# Patient Record
Sex: Male | Born: 1955 | Race: White | Hispanic: No | Smoking: Never smoker
Health system: Southern US, Community
[De-identification: ages and names within clinical notes are randomized; demographics above are authoritative.]

## PROBLEM LIST (undated history)

## (undated) DIAGNOSIS — E119 Type 2 diabetes mellitus without complications: Secondary | ICD-10-CM

## (undated) DIAGNOSIS — I10 Essential (primary) hypertension: Secondary | ICD-10-CM

## (undated) DIAGNOSIS — I2510 Atherosclerotic heart disease of native coronary artery without angina pectoris: Secondary | ICD-10-CM

## (undated) DIAGNOSIS — E785 Hyperlipidemia, unspecified: Secondary | ICD-10-CM

## (undated) DIAGNOSIS — F419 Anxiety disorder, unspecified: Secondary | ICD-10-CM

## (undated) HISTORY — DX: Essential (primary) hypertension: I10

## (undated) HISTORY — DX: Atherosclerotic heart disease of native coronary artery without angina pectoris: I25.10

## (undated) HISTORY — DX: Anxiety disorder, unspecified: F41.9

## (undated) HISTORY — DX: Hyperlipidemia, unspecified: E78.5

## (undated) HISTORY — DX: Type 2 diabetes mellitus without complications: E11.9

## (undated) HISTORY — PX: CORONARY ARTERY BYPASS GRAFT: SHX141

## (undated) HISTORY — PX: EXPLORATION POST OPERATIVE OPEN HEART: SHX5061

---

## 2003-07-04 ENCOUNTER — Emergency Department (HOSPITAL_COMMUNITY): Admission: EM | Admit: 2003-07-04 | Discharge: 2003-07-04 | Payer: Self-pay | Admitting: Emergency Medicine

## 2003-07-04 ENCOUNTER — Encounter: Payer: Self-pay | Admitting: Emergency Medicine

## 2003-07-25 ENCOUNTER — Ambulatory Visit: Admission: RE | Admit: 2003-07-25 | Discharge: 2003-07-25 | Payer: Self-pay | Admitting: Family Medicine

## 2003-07-25 ENCOUNTER — Encounter: Payer: Self-pay | Admitting: Family Medicine

## 2003-08-23 ENCOUNTER — Emergency Department (HOSPITAL_COMMUNITY): Admission: EM | Admit: 2003-08-23 | Discharge: 2003-08-23 | Payer: Self-pay

## 2003-08-24 ENCOUNTER — Encounter: Payer: Self-pay | Admitting: Emergency Medicine

## 2003-09-03 ENCOUNTER — Ambulatory Visit (HOSPITAL_COMMUNITY): Admission: RE | Admit: 2003-09-03 | Discharge: 2003-09-03 | Payer: Self-pay | Admitting: Gastroenterology

## 2018-12-01 DIAGNOSIS — E785 Hyperlipidemia, unspecified: Secondary | ICD-10-CM | POA: Insufficient documentation

## 2018-12-01 DIAGNOSIS — M109 Gout, unspecified: Secondary | ICD-10-CM | POA: Insufficient documentation

## 2018-12-01 DIAGNOSIS — I25118 Atherosclerotic heart disease of native coronary artery with other forms of angina pectoris: Secondary | ICD-10-CM | POA: Insufficient documentation

## 2018-12-01 DIAGNOSIS — F419 Anxiety disorder, unspecified: Secondary | ICD-10-CM | POA: Insufficient documentation

## 2018-12-01 DIAGNOSIS — E291 Testicular hypofunction: Secondary | ICD-10-CM | POA: Insufficient documentation

## 2018-12-01 DIAGNOSIS — E119 Type 2 diabetes mellitus without complications: Secondary | ICD-10-CM | POA: Insufficient documentation

## 2019-01-27 DIAGNOSIS — H1013 Acute atopic conjunctivitis, bilateral: Secondary | ICD-10-CM | POA: Diagnosis not present

## 2019-01-27 DIAGNOSIS — E119 Type 2 diabetes mellitus without complications: Secondary | ICD-10-CM | POA: Diagnosis not present

## 2019-02-18 DIAGNOSIS — R6889 Other general symptoms and signs: Secondary | ICD-10-CM | POA: Diagnosis not present

## 2019-02-18 DIAGNOSIS — J181 Lobar pneumonia, unspecified organism: Secondary | ICD-10-CM | POA: Diagnosis not present

## 2019-02-18 DIAGNOSIS — R05 Cough: Secondary | ICD-10-CM | POA: Diagnosis not present

## 2019-04-14 DIAGNOSIS — Z23 Encounter for immunization: Secondary | ICD-10-CM | POA: Diagnosis not present

## 2019-04-14 DIAGNOSIS — Z0000 Encounter for general adult medical examination without abnormal findings: Secondary | ICD-10-CM | POA: Diagnosis not present

## 2019-04-14 DIAGNOSIS — E119 Type 2 diabetes mellitus without complications: Secondary | ICD-10-CM | POA: Diagnosis not present

## 2019-04-14 DIAGNOSIS — Z1159 Encounter for screening for other viral diseases: Secondary | ICD-10-CM | POA: Diagnosis not present

## 2019-04-14 DIAGNOSIS — E785 Hyperlipidemia, unspecified: Secondary | ICD-10-CM | POA: Diagnosis not present

## 2019-04-14 DIAGNOSIS — L723 Sebaceous cyst: Secondary | ICD-10-CM | POA: Diagnosis not present

## 2019-04-14 DIAGNOSIS — I10 Essential (primary) hypertension: Secondary | ICD-10-CM | POA: Diagnosis not present

## 2019-04-14 DIAGNOSIS — F419 Anxiety disorder, unspecified: Secondary | ICD-10-CM | POA: Diagnosis not present

## 2019-04-14 DIAGNOSIS — I25119 Atherosclerotic heart disease of native coronary artery with unspecified angina pectoris: Secondary | ICD-10-CM | POA: Diagnosis not present

## 2019-04-14 DIAGNOSIS — M109 Gout, unspecified: Secondary | ICD-10-CM | POA: Diagnosis not present

## 2019-04-15 DIAGNOSIS — F419 Anxiety disorder, unspecified: Secondary | ICD-10-CM | POA: Diagnosis not present

## 2019-04-15 DIAGNOSIS — M109 Gout, unspecified: Secondary | ICD-10-CM | POA: Diagnosis not present

## 2019-04-15 DIAGNOSIS — Z951 Presence of aortocoronary bypass graft: Secondary | ICD-10-CM | POA: Insufficient documentation

## 2019-04-15 DIAGNOSIS — I2510 Atherosclerotic heart disease of native coronary artery without angina pectoris: Secondary | ICD-10-CM | POA: Diagnosis not present

## 2019-04-15 DIAGNOSIS — E782 Mixed hyperlipidemia: Secondary | ICD-10-CM | POA: Diagnosis not present

## 2019-04-15 DIAGNOSIS — I10 Essential (primary) hypertension: Secondary | ICD-10-CM | POA: Diagnosis not present

## 2019-04-15 DIAGNOSIS — R0789 Other chest pain: Secondary | ICD-10-CM | POA: Diagnosis not present

## 2019-04-15 DIAGNOSIS — Z79899 Other long term (current) drug therapy: Secondary | ICD-10-CM | POA: Diagnosis not present

## 2019-04-15 DIAGNOSIS — E785 Hyperlipidemia, unspecified: Secondary | ICD-10-CM | POA: Diagnosis not present

## 2019-04-15 DIAGNOSIS — R001 Bradycardia, unspecified: Secondary | ICD-10-CM | POA: Diagnosis not present

## 2019-04-15 DIAGNOSIS — I4519 Other right bundle-branch block: Secondary | ICD-10-CM | POA: Diagnosis not present

## 2019-04-15 DIAGNOSIS — R072 Precordial pain: Secondary | ICD-10-CM | POA: Diagnosis not present

## 2019-04-15 DIAGNOSIS — R079 Chest pain, unspecified: Secondary | ICD-10-CM | POA: Diagnosis not present

## 2019-04-15 DIAGNOSIS — Z7982 Long term (current) use of aspirin: Secondary | ICD-10-CM | POA: Diagnosis not present

## 2019-04-15 DIAGNOSIS — Z87891 Personal history of nicotine dependence: Secondary | ICD-10-CM | POA: Diagnosis not present

## 2019-06-08 DIAGNOSIS — Z951 Presence of aortocoronary bypass graft: Secondary | ICD-10-CM | POA: Diagnosis not present

## 2019-06-08 DIAGNOSIS — E782 Mixed hyperlipidemia: Secondary | ICD-10-CM | POA: Diagnosis not present

## 2019-06-08 DIAGNOSIS — I10 Essential (primary) hypertension: Secondary | ICD-10-CM | POA: Diagnosis not present

## 2019-06-08 DIAGNOSIS — I25119 Atherosclerotic heart disease of native coronary artery with unspecified angina pectoris: Secondary | ICD-10-CM | POA: Diagnosis not present

## 2019-06-08 DIAGNOSIS — I25118 Atherosclerotic heart disease of native coronary artery with other forms of angina pectoris: Secondary | ICD-10-CM | POA: Diagnosis not present

## 2019-07-08 ENCOUNTER — Ambulatory Visit (INDEPENDENT_AMBULATORY_CARE_PROVIDER_SITE_OTHER): Payer: PPO

## 2019-07-08 ENCOUNTER — Ambulatory Visit (INDEPENDENT_AMBULATORY_CARE_PROVIDER_SITE_OTHER): Payer: PPO | Admitting: Orthopaedic Surgery

## 2019-07-08 ENCOUNTER — Encounter: Payer: Self-pay | Admitting: Orthopaedic Surgery

## 2019-07-08 ENCOUNTER — Telehealth: Payer: Self-pay

## 2019-07-08 DIAGNOSIS — G8929 Other chronic pain: Secondary | ICD-10-CM

## 2019-07-08 DIAGNOSIS — M25562 Pain in left knee: Secondary | ICD-10-CM

## 2019-07-08 MED ORDER — LIDOCAINE HCL 1 % IJ SOLN
3.0000 mL | INTRAMUSCULAR | Status: AC | PRN
  Administered 2019-07-08: 3 mL

## 2019-07-08 MED ORDER — METHYLPREDNISOLONE ACETATE 40 MG/ML IJ SUSP
40.0000 mg | INTRAMUSCULAR | Status: AC | PRN
  Administered 2019-07-08: 40 mg via INTRA_ARTICULAR

## 2019-07-08 NOTE — Telephone Encounter (Signed)
Noted  

## 2019-07-08 NOTE — Progress Notes (Signed)
Office Visit Note   Patient: Shane Garrett           Date of Birth: [birth year]           MRN: [id number] Visit Date: 07/08/2019              Requested by: No referring provider defined for this encounter. PCP: Verdell Carmine., MD   Assessment & Plan: Visit Diagnoses:  1. Chronic pain of left knee     Plan: I do think he is the perfect candidate for steroid injection in his left knee today and a hyaluronic acid injection in about 4 weeks from now to treat the pain from osteoarthritis and likely chronic gout as well.  He cannot take anti-inflammatories due to being on blood thinners.  He is to work on quad strength exercises.  He can get back to walking but I want him to avoid hills.  All question concerns were answered addressed.  We will see him back in 4 weeks to hopefully place hyaluronic acid into his left knee.  If he continues to have persistent pain will likely order an MRI of that knee as well.  Follow-Up Instructions: Return in about 4 weeks (around 08/05/2019).   Orders:  Orders Placed This Encounter  Procedures  . Large Joint Inj  . XR Knee 1-2 Views Left   No orders of the defined types were placed in this encounter.     Procedures: Large Joint Inj: L knee on 07/08/2019 10:15 AM Indications: diagnostic evaluation and pain Details: 22 G 1.5 in needle, superolateral approach  Arthrogram: No  Medications: 3 mL lidocaine 1 %; 40 mg methylPREDNISolone acetate 40 MG/ML Outcome: tolerated well, no immediate complications Procedure, treatment alternatives, risks and benefits explained, specific risks discussed. Consent was given by the patient. Immediately prior to procedure a time out was called to verify the correct patient, procedure, equipment, support staff and site/side marked as required. Patient was prepped and draped in the usual sterile fashion.       Clinical Data: No additional findings.   Subjective: Chief Complaint  Patient presents with  . Left  Knee - Pain  The patient is a very pleasant 63 year old gentleman who comes in for evaluation treatment of chronic left knee pain.  He does have a history of heart bypass surgery and is on blood thinning medication.  He also has a long history of chronic gout.  He is never had knee issues as a relates to his gout he states.  He does point to the medial joint line of his left knee that is the source of his pain.  It hurts with activities daily living and has decreased his mobility and his quality of life.  He is someone who likes to walk 2 miles a day.  He also rides a motorcycle and plays golf.  He is retired.  He is never injured his left knee before.  HPI  Review of Systems He currently denies any headache, chest pain, shortness of breath, fever, chills, nausea, vomiting  Objective: Vital Signs: There were no vitals taken for this visit.  Physical Exam He is alert and orient x3 and in no acute distress Ortho Exam Examination of his left knee shows just slight varus malalignment.  There is only slight effusion comparing his left and right knees.  There is quite significant medial joint line tenderness of his left knee.  The knee feels ligamentously stable with good range of motion. Specialty Comments:  No specialty comments available.  Imaging: Xr Knee 1-2 Views Left  Result Date: 07/08/2019 2 views of the left knee show well-maintained joint space.  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    PMFS History: There are no active problems to display for this patient.  History reviewed. No pertinent past medical history.  History reviewed. No pertinent family history.  History reviewed. No pertinent surgical history. Social History   Occupational History  . Not on file  Tobacco Use  . Smoking status: Not on file  Substance and Sexual Activity  . Alcohol use: Not on file  . Drug use: Not on file  . Sexual activity: Not on  file

## 2019-07-08 NOTE — Telephone Encounter (Signed)
Left knee gel injection ?

## 2019-07-09 ENCOUNTER — Telehealth: Payer: Self-pay

## 2019-07-09 NOTE — Telephone Encounter (Signed)
Submitted VOB for Monovisc, left knee. 

## 2019-07-10 ENCOUNTER — Telehealth: Payer: Self-pay

## 2019-07-10 NOTE — Telephone Encounter (Signed)
Approved for Monovisc, left knee. [place] Patient will be responsible for 20% OOP. May have a Co-pay of $30.00 No PA required  Appt. 08/05/2019 with Dr. Ninfa Linden

## 2019-07-30 ENCOUNTER — Ambulatory Visit (INDEPENDENT_AMBULATORY_CARE_PROVIDER_SITE_OTHER): Payer: PPO | Admitting: Orthopaedic Surgery

## 2019-07-30 ENCOUNTER — Telehealth: Payer: Self-pay | Admitting: Orthopaedic Surgery

## 2019-07-30 DIAGNOSIS — M25561 Pain in right knee: Secondary | ICD-10-CM

## 2019-07-30 MED ORDER — LIDOCAINE HCL 1 % IJ SOLN
3.0000 mL | INTRAMUSCULAR | Status: AC | PRN
  Administered 2019-07-30: 3 mL

## 2019-07-30 MED ORDER — METHYLPREDNISOLONE ACETATE 40 MG/ML IJ SUSP
40.0000 mg | INTRAMUSCULAR | Status: AC | PRN
  Administered 2019-07-30: 40 mg via INTRA_ARTICULAR

## 2019-07-30 NOTE — Progress Notes (Signed)
Office Visit Note   Patient: Shane Garrett           Date of Birth: 1956/05/24           MRN: [id number] Visit Date: 07/30/2019              Requested by: Verdell Carmine., MD [address] [place],  [place] [address] PCP: Verdell Carmine., MD   Assessment & Plan: Visit Diagnoses:  1. Acute pain of right knee     Plan: Since a steroid injection helped recently on his left knee I felt it was reasonable to try this on his right knee today as that he.  He understands fully the risks and benefits of injections and tolerated it well.  All question concerns were answered addressed.  Follow-up can be as needed.  Follow-Up Instructions: Return if symptoms worsen or fail to improve.   Orders:  Orders Placed This Encounter  Procedures  . Large Joint Inj   No orders of the defined types were placed in this encounter.     Procedures: Large Joint Inj: R knee on 07/30/2019 3:27 PM Indications: diagnostic evaluation and pain Details: 22 G 1.5 in needle, superolateral approach  Arthrogram: No  Medications: 3 mL lidocaine 1 %; 40 mg methylPREDNISolone acetate 40 MG/ML Outcome: tolerated well, no immediate complications Procedure, treatment alternatives, risks and benefits explained, specific risks discussed. Consent was given by the patient. Immediately prior to procedure a time out was called to verify the correct patient, procedure, equipment, support staff and site/side marked as required. Patient was prepped and draped in the usual sterile fashion.       Clinical Data: No additional findings.   Subjective: Chief Complaint  Patient presents with  . Right Knee - Pain  The patient comes in today requesting steroid injection in his right knee.  He has acute on chronic right knee pain and is leaving for the beach tomorrow.  He has had a successful injection recently and his left knee and that is done well for him so he would like to have on his right knee today.  He has had  x-rays already of this left knee that did show the right knee on the AP view and there is still well-maintained joint space and not right knee and I did look at those films again today.  He denies any locking catching with the right knee but does report some knee swelling.  It hurts mainly with activities.  HPI  Review of Systems He currently denies any headache, chest pain, shortness of breath, fever, chills, nausea, vomiting  Objective: Vital Signs: There were no vitals taken for this visit.  Physical Exam He is alert and orient x3 and in no acute distress.  Examination of his right knee shows good range of motion and his ligaments are stable but is globally tender and there is a slight effusion. Ortho Exam  Specialty Comments:  No specialty comments available.  Imaging: No results found. 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  PMFS History: There are no active problems to display for this patient.  No past medical history on file.  No family history on file.  No past surgical history on file. Social History   Occupational History  . Not on file  Tobacco Use  . Smoking status: Not on file  Substance and Sexual Activity  . Alcohol use:  Not on file  . Drug use: Not on file  . Sexual activity: Not on file

## 2019-07-30 NOTE — Telephone Encounter (Signed)
Called patient and he declined appointment- needed earlier appointment before going out of town. Canceled appointment scheduled 08/04/2019 at 9:15am

## 2019-07-30 NOTE — Telephone Encounter (Signed)
We can work him in next Tuesday or Wednesday morning since it's just an injection only

## 2019-07-30 NOTE — Telephone Encounter (Signed)
Scheduled today

## 2019-07-30 NOTE — Telephone Encounter (Signed)
Patient called asked if he can be worked into Dr Renard Hamper or [place] schedule to get a steroid injection in his right knee. Patient would like to get in  Early next week. The number to contact patient is [phone]

## 2019-08-04 ENCOUNTER — Ambulatory Visit: Payer: PPO | Admitting: Orthopaedic Surgery

## 2019-08-05 ENCOUNTER — Other Ambulatory Visit: Payer: Self-pay

## 2019-08-05 ENCOUNTER — Encounter: Payer: Self-pay | Admitting: Orthopaedic Surgery

## 2019-08-05 ENCOUNTER — Ambulatory Visit (INDEPENDENT_AMBULATORY_CARE_PROVIDER_SITE_OTHER): Payer: PPO | Admitting: Orthopaedic Surgery

## 2019-08-05 DIAGNOSIS — M1712 Unilateral primary osteoarthritis, left knee: Secondary | ICD-10-CM

## 2019-08-05 DIAGNOSIS — M25562 Pain in left knee: Secondary | ICD-10-CM

## 2019-08-05 DIAGNOSIS — G8929 Other chronic pain: Secondary | ICD-10-CM

## 2019-08-05 MED ORDER — HYALURONAN 88 MG/4ML IX SOSY
88.0000 mg | PREFILLED_SYRINGE | INTRA_ARTICULAR | Status: AC | PRN
  Administered 2019-08-05: 88 mg via INTRA_ARTICULAR

## 2019-08-05 NOTE — Progress Notes (Signed)
   Procedure Note  Patient: Shane Garrett             Date of Birth: November 20, 1956           MRN: [id number]             Visit Date: 08/05/2019  Procedures: Visit Diagnoses:  1. Chronic pain of left knee   2. Unilateral primary osteoarthritis, left knee     Large Joint Inj on 08/05/2019 9:22 AM Indications: diagnostic evaluation and pain Details: 22 G 1.5 in needle, superolateral approach  Arthrogram: No  Medications: 88 mg Hyaluronan 88 MG/4ML Outcome: tolerated well, no immediate complications Procedure, treatment alternatives, risks and benefits explained, specific risks discussed. Consent was given by the patient. Immediately prior to procedure a time out was called to verify the correct patient, procedure, equipment, support staff and site/side marked as required. Patient was prepped and draped in the usual sterile fashion.    The patient comes in today for scheduled hyaluronic acid injection in his left knee to treat the pain from osteoarthritis.  He is tried failed other conservative treatment measures including steroid injection.  He has been working on activity modification and quad strengthening exercises.  He has well-documented osteoarthritis of his left knee.  Examination left knee today there is no significant effusion.  He has good range of motion of his knee but global tenderness.  He did tolerate the Monovisc injection well without any issues.  All questions concerns were answered and addressed.  Follow-up is as needed.

## 2019-08-14 DIAGNOSIS — H8111 Benign paroxysmal vertigo, right ear: Secondary | ICD-10-CM | POA: Diagnosis not present

## 2019-10-05 ENCOUNTER — Ambulatory Visit (INDEPENDENT_AMBULATORY_CARE_PROVIDER_SITE_OTHER): Payer: PPO | Admitting: Family Medicine

## 2019-10-05 ENCOUNTER — Other Ambulatory Visit: Payer: Self-pay

## 2019-10-05 ENCOUNTER — Telehealth: Payer: Self-pay

## 2019-10-05 ENCOUNTER — Encounter: Payer: Self-pay | Admitting: Family Medicine

## 2019-10-05 VITALS — BP 118/82 | HR 60 | Temp 98.0°F | Ht 73.0 in | Wt 259.4 lb

## 2019-10-05 DIAGNOSIS — F419 Anxiety disorder, unspecified: Secondary | ICD-10-CM | POA: Diagnosis not present

## 2019-10-05 DIAGNOSIS — I25118 Atherosclerotic heart disease of native coronary artery with other forms of angina pectoris: Secondary | ICD-10-CM | POA: Diagnosis not present

## 2019-10-05 DIAGNOSIS — E785 Hyperlipidemia, unspecified: Secondary | ICD-10-CM

## 2019-10-05 DIAGNOSIS — E119 Type 2 diabetes mellitus without complications: Secondary | ICD-10-CM | POA: Diagnosis not present

## 2019-10-05 MED ORDER — CITALOPRAM HYDROBROMIDE 40 MG PO TABS: 40.0000 mg | ORAL_TABLET | Freq: Every day | ORAL | 1 refills | Status: DC

## 2019-10-05 NOTE — Telephone Encounter (Signed)
Okay 

## 2019-10-05 NOTE — Patient Instructions (Signed)
Great to meet you! Continue current medications. Please schedule a lab visit.

## 2019-10-05 NOTE — Telephone Encounter (Signed)
Okay to schedule pt's 6 month follow up with Dr. Ethelene Hal.

## 2019-10-05 NOTE — Telephone Encounter (Signed)
Copied from [place] [id number]. Topic: General - Other >> Oct 05, 2019 11:33 AM Carolyn Stare wrote: Pt was new pt today and said he would like to [facility] to [place], not sure if he neeed another appt

## 2019-10-05 NOTE — Telephone Encounter (Signed)
Okay for transfer 

## 2019-10-05 NOTE — Telephone Encounter (Signed)
I called and spoke to patient. Patient scheduled for [facility] with Dr. Ethelene Hal for 6 month follow up - due to Dr. Zigmund Daniel leaving practice.

## 2019-10-05 NOTE — Telephone Encounter (Signed)
Yes, I discussed with him that since I would be leaving at the end of January I recommend that he schedule 6 month f/u with Dr. Ethelene Hal or Dr. Bryan Lemma.

## 2019-10-11 NOTE — Assessment & Plan Note (Signed)
-  Tolerating crestor well, update lipids.

## 2019-10-11 NOTE — Progress Notes (Signed)
Shane Garrett - 63 y.o. male MRN [id number]  Date of birth: [birth year]  Subjective Chief Complaint  Patient presents with  . Establish Care    patients like to discuss meds and concerns. had physical 6 months ago.    HPI Shane Garrett is a 63 y.o. male with history of T2DM, GAD, HTN, HLD, and CAD s/p CABG in 2012 here today for initial visit and to establish care.    -HTN/CAD/HLD:  Followed by cardiology with [facility] previously.  He would like to see cardiology in [place] now that he is living here.  He is on imdur for history of chronic angina.  He denies recent chest pain or nitro use. He is compliant with toprol and crestor.    -T2DM:  Currently managing with diet alone.  A1c in 03/2019 was 6.4%.  He denies increased thirst or urination.  He has an eye doctor.  He had prevnar 03/2019.     -GAD:  Current tx with citalopram.  He has rx for clonazepam but has not taken this in several months.   He feels that symptoms are well controlled with current dose of clonazepam.  He denies side effects at this time.   Depression screen PHQ 2/9 10/05/2019  Decreased Interest 0  Down, Depressed, Hopeless 0  PHQ - 2 Score 0    ROS:  A comprehensive ROS was completed and negative except as noted per HPI    No Known Allergies  Past Medical History:  Diagnosis Date  . Anxiety   . Diabetes mellitus without complication ([place])   . Hypertension     Past Surgical History:  Procedure Laterality Date  . EXPLORATION POST OPERATIVE OPEN HEART      Social History   Socioeconomic History  . Marital status: Married    Spouse name: Not on file  . Number of children: Not on file  . Years of education: Not on file  . Highest education level: Not on file  Occupational History  . Not on file  Social Needs  . Financial resource strain: Not on file  . Food insecurity    Worry: Not on file    Inability: Not on file  . Transportation needs    Medical: Not on file    Non-medical: Not on  file  Tobacco Use  . Smoking status: Never Smoker  . Smokeless tobacco: Never Used  Substance and Sexual Activity  . Alcohol use: Never    Frequency: Never  . Drug use: Never  . Sexual activity: Not on file  Lifestyle  . Physical activity    Days per week: Not on file    Minutes per session: Not on file  . Stress: Not on file  Relationships  . Social Herbalist on phone: Not on file    Gets together: Not on file    Attends religious service: Not on file    Active member of club or organization: Not on file    Attends meetings of clubs or organizations: Not on file    Relationship status: Not on file  Other Topics Concern  . Not on file  Social History Narrative  . Not on file    Family History  Problem Relation Age of Onset  . Cancer Mother   . Diabetes Brother     Health Maintenance  Topic Date Due  . HIV Screening  02/12/1969  . TETANUS/TDAP  02/12/1973  . INFLUENZA VACCINE  02/24/2020 (Originally 06/27/2019)  .  HEMOGLOBIN A1C  10/15/2019  . OPHTHALMOLOGY EXAM  01/27/2020  . FOOT EXAM  04/13/2020  . PNA vac Low Risk Adult (2 of 2 - PPSV23) 04/13/2020  . COLONOSCOPY  03/09/2027  . Hepatitis C Screening  Completed    ----------------------------------------------------------------------------------------------------------------------------------------------------------------------------------------------------------------- Physical Exam BP 118/82   Pulse 60   Temp 98 F (36.7 C) (Temporal)   Ht 6\' 1"  (1.854 m)   Wt 259 lb 6.4 oz (117.7 kg)   SpO2 97%   BMI 34.22 kg/m   Physical Exam Constitutional:      Appearance: Normal appearance.  HENT:     Head: Normocephalic and atraumatic.  Eyes:     General: No scleral icterus. Neck:     Musculoskeletal: Neck supple.  Cardiovascular:     Rate and Rhythm: Normal rate and regular rhythm.  Pulmonary:     Effort: Pulmonary effort is normal.     Breath sounds: Normal breath sounds.  Abdominal:      General: Abdomen is flat.  Skin:    General: Skin is warm and dry.  Neurological:     General: No focal deficit present.     Mental Status: He is alert.  Psychiatric:        Mood and Affect: Mood normal.        Behavior: Behavior normal.     ------------------------------------------------------------------------------------------------------------------------------------------------------------------------------------------------------------------- Assessment and Plan  Coronary artery disease of native artery of native heart with stable angina pectoris (HCC) -Stable at this time, no increased anginal symptoms.  Continue current medications and referral placed to cardiology.   Type 2 diabetes mellitus (HCC) -Update a1c, lipids and microalbumin.  -Recommend low carb diet with regular exercise.   Hyperlipemia -Tolerating crestor well, update lipids.   Anxiety -He is doing well with citalopram.  Continue at current dose and rx renewed.  Has clonazepam as well but rarely uses, PDMP shows now recent fills.

## 2019-10-11 NOTE — Assessment & Plan Note (Signed)
-  Stable at this time, no increased anginal symptoms.  Continue current medications and referral placed to cardiology.

## 2019-10-11 NOTE — Assessment & Plan Note (Signed)
-  He is doing well with citalopram.  Continue at current dose and rx renewed.  Has clonazepam as well but rarely uses, PDMP shows now recent fills.

## 2019-10-11 NOTE — Assessment & Plan Note (Signed)
-  Update a1c, lipids and microalbumin.  -Recommend low carb diet with regular exercise.

## 2019-10-12 ENCOUNTER — Other Ambulatory Visit (INDEPENDENT_AMBULATORY_CARE_PROVIDER_SITE_OTHER): Payer: PPO

## 2019-10-12 ENCOUNTER — Other Ambulatory Visit: Payer: Self-pay

## 2019-10-12 DIAGNOSIS — I25118 Atherosclerotic heart disease of native coronary artery with other forms of angina pectoris: Secondary | ICD-10-CM

## 2019-10-12 DIAGNOSIS — E119 Type 2 diabetes mellitus without complications: Secondary | ICD-10-CM | POA: Diagnosis not present

## 2019-10-12 DIAGNOSIS — E785 Hyperlipidemia, unspecified: Secondary | ICD-10-CM | POA: Diagnosis not present

## 2019-10-12 LAB — LIPID PANEL
Cholesterol: 167 mg/dL (ref 0–200)
HDL: 38.6 mg/dL — ABNORMAL LOW (ref >39.00)
LDL Cholesterol: 88 mg/dL (ref 0–99)
NonHDL: 128.06
Total CHOL/HDL Ratio: 4
Triglycerides: 198 mg/dL — ABNORMAL HIGH (ref 0.0–149.0)
VLDL: 39.6 mg/dL (ref 0.0–40.0)

## 2019-10-12 LAB — HEMOGLOBIN A1C: Hgb A1c MFr Bld: 7 % — ABNORMAL HIGH (ref 4.6–6.5)

## 2019-10-12 LAB — COMPREHENSIVE METABOLIC PANEL
ALT: 20 U/L (ref 0–53)
AST: 18 U/L (ref 0–37)
Albumin: 4.1 g/dL (ref 3.5–5.2)
Alkaline Phosphatase: 68 U/L (ref 39–117)
BUN: 16 mg/dL (ref 6–23)
CO2: 27 mEq/L (ref 19–32)
Calcium: 9.7 mg/dL (ref 8.4–10.5)
Chloride: 106 mEq/L (ref 96–112)
Creatinine, Ser: 1.11 mg/dL (ref 0.40–1.50)
GFR: 66.35 mL/min (ref >60.00)
Glucose, Bld: 125 mg/dL — ABNORMAL HIGH (ref 70–99)
Potassium: 4.1 mEq/L (ref 3.5–5.1)
Sodium: 141 mEq/L (ref 135–145)
Total Bilirubin: 0.4 mg/dL (ref 0.2–1.2)
Total Protein: 7 g/dL (ref 6.0–8.3)

## 2019-10-12 LAB — MICROALBUMIN / CREATININE URINE RATIO
Creatinine,U: 108.1 mg/dL
Microalb Creat Ratio: 0.6 mg/g (ref 0.0–30.0)
Microalb, Ur: <0.7 mg/dL (ref 0.0–1.9)

## 2019-10-15 ENCOUNTER — Telehealth: Payer: Self-pay | Admitting: Family Medicine

## 2019-10-15 NOTE — Telephone Encounter (Signed)
Patient called back and would like rx for metformin.

## 2019-10-15 NOTE — Telephone Encounter (Addendum)
Pt is calling to let md know he would like to go ahead and get rx for metformin. [facility] in [place]

## 2019-10-15 NOTE — Progress Notes (Signed)
Please let patient know:  A1c is up a little from last check (6.4%-->7.0%).  I would recommend starting metformin to help control his blood sugars in addition to a low carbohydrate diet and exercise.   Cholesterol looks good, continue rosuvastatin at current dose.

## 2019-10-16 ENCOUNTER — Other Ambulatory Visit: Payer: Self-pay | Admitting: Family Medicine

## 2019-10-16 ENCOUNTER — Encounter: Payer: Self-pay | Admitting: Family Medicine

## 2019-10-16 MED ORDER — METFORMIN HCL ER 500 MG PO TB24: 500.0000 mg | ORAL_TABLET | Freq: Every day | ORAL | 1 refills | Status: DC

## 2019-10-16 NOTE — Telephone Encounter (Signed)
Completed.

## 2019-10-30 ENCOUNTER — Other Ambulatory Visit: Payer: Self-pay | Admitting: Family Medicine

## 2019-10-30 ENCOUNTER — Telehealth: Payer: Self-pay | Admitting: Family Medicine

## 2019-10-30 MED ORDER — CLONAZEPAM 0.5 MG PO TABS: 0.5000 mg | ORAL_TABLET | Freq: Every day | ORAL | 0 refills | Status: DC | PRN

## 2019-10-30 NOTE — Telephone Encounter (Signed)
Pt is aware.  

## 2019-10-30 NOTE — Telephone Encounter (Signed)
Rx renewed

## 2019-10-30 NOTE — Telephone Encounter (Signed)
Please advise, we never refill this before.

## 2019-10-30 NOTE — Telephone Encounter (Signed)
Medication Refill - Medication:clonazePAM (KLONOPIN) 0.5 MG tablet [[id number]]    Has the patient contacted their pharmacy? No. (Agent: If no, request that the patient contact the pharmacy for the refill.) (Agent: If yes, when and what did the pharmacy advise?)  Preferred Pharmacy (with phone number or street name): [facility] on [address]  Agent: Please be advised that RX refills may take up to 3 business days. We ask that you follow-up with your pharmacy.

## 2019-12-11 ENCOUNTER — Other Ambulatory Visit: Payer: Self-pay | Admitting: Family Medicine

## 2019-12-11 ENCOUNTER — Telehealth: Payer: Self-pay | Admitting: Family Medicine

## 2019-12-11 NOTE — Telephone Encounter (Signed)
Pt was called and made aware refill was sent in. He had no additional questions.

## 2019-12-11 NOTE — Telephone Encounter (Signed)
Patient is calling and is requesting a refill for Rosuvastatin sent to [facility] on [address].

## 2019-12-11 NOTE — Telephone Encounter (Signed)
Sent in

## 2020-03-10 DIAGNOSIS — H5213 Myopia, bilateral: Secondary | ICD-10-CM | POA: Diagnosis not present

## 2020-03-10 DIAGNOSIS — E119 Type 2 diabetes mellitus without complications: Secondary | ICD-10-CM | POA: Diagnosis not present

## 2020-03-10 LAB — HM DIABETES EYE EXAM

## 2020-03-22 ENCOUNTER — Other Ambulatory Visit: Payer: Self-pay | Admitting: Family Medicine

## 2020-03-22 NOTE — Telephone Encounter (Signed)
Last OV 10/05/19 Last fill 10/05/19  #90/1 Next OV 04/04/20 w/[place]

## 2020-04-04 ENCOUNTER — Encounter: Payer: Self-pay | Admitting: Family Medicine

## 2020-04-04 ENCOUNTER — Other Ambulatory Visit: Payer: Self-pay

## 2020-04-04 ENCOUNTER — Ambulatory Visit (INDEPENDENT_AMBULATORY_CARE_PROVIDER_SITE_OTHER): Payer: PPO | Admitting: Family Medicine

## 2020-04-04 VITALS — BP 134/80 | HR 68 | Temp 96.8°F | Ht 73.0 in | Wt 262.2 lb

## 2020-04-04 DIAGNOSIS — F419 Anxiety disorder, unspecified: Secondary | ICD-10-CM | POA: Diagnosis not present

## 2020-04-04 DIAGNOSIS — E119 Type 2 diabetes mellitus without complications: Secondary | ICD-10-CM | POA: Diagnosis not present

## 2020-04-04 DIAGNOSIS — L720 Epidermal cyst: Secondary | ICD-10-CM | POA: Diagnosis not present

## 2020-04-04 DIAGNOSIS — E785 Hyperlipidemia, unspecified: Secondary | ICD-10-CM | POA: Diagnosis not present

## 2020-04-04 DIAGNOSIS — I10 Essential (primary) hypertension: Secondary | ICD-10-CM | POA: Diagnosis not present

## 2020-04-04 DIAGNOSIS — Z0000 Encounter for general adult medical examination without abnormal findings: Secondary | ICD-10-CM | POA: Diagnosis not present

## 2020-04-04 DIAGNOSIS — I25118 Atherosclerotic heart disease of native coronary artery with other forms of angina pectoris: Secondary | ICD-10-CM

## 2020-04-04 MED ORDER — EZETIMIBE 10 MG PO TABS: 10.0000 mg | ORAL_TABLET | Freq: Every day | ORAL | 3 refills | Status: DC

## 2020-04-04 NOTE — Patient Instructions (Signed)
Health Maintenance After Age 66 After age 27, you are at a higher risk for certain long-term diseases and infections as well as injuries from falls. Falls are a major cause of broken bones and head injuries in people who are older than age 29. Getting regular preventive care can help to keep you healthy and well. Preventive care includes getting regular testing and making lifestyle changes as recommended by your health care provider. Talk with your health care provider about:  Which screenings and tests you should have. A screening is a test that checks for a disease when you have no symptoms.  A diet and exercise plan that is right for you. What should I know about screenings and tests to prevent falls? Screening and testing are the best ways to find a health problem early. Early diagnosis and treatment give you the best chance of managing medical conditions that are common after age 79. Certain conditions and lifestyle choices may make you more likely to have a fall. Your health care provider may recommend:  Regular vision checks. Poor vision and conditions such as cataracts can make you more likely to have a fall. If you wear glasses, make sure to get your prescription updated if your vision changes.  Medicine review. Work with your health care provider to regularly review all of the medicines you are taking, including over-the-counter medicines. Ask your health care provider about any side effects that may make you more likely to have a fall. Tell your health care provider if any medicines that you take make you feel dizzy or sleepy.  Osteoporosis screening. Osteoporosis is a condition that causes the bones to get weaker. This can make the bones weak and cause them to break more easily.  Blood pressure screening. Blood pressure changes and medicines to control blood pressure can make you feel dizzy.  Strength and balance checks. Your health care provider may recommend certain tests to check your  strength and balance while standing, walking, or changing positions.  Foot health exam. Foot pain and numbness, as well as not wearing proper footwear, can make you more likely to have a fall.  Depression screening. You may be more likely to have a fall if you have a fear of falling, feel emotionally low, or feel unable to do activities that you used to do.  Alcohol use screening. Using too much alcohol can affect your balance and may make you more likely to have a fall. What actions can I take to lower my risk of falls? General instructions  Talk with your health care provider about your risks for falling. Tell your health care provider if: ? You fall. Be sure to tell your health care provider about all falls, even ones that seem minor. ? You feel dizzy, sleepy, or off-balance.  Take over-the-counter and prescription medicines only as told by your health care provider. These include any supplements.  Eat a healthy diet and maintain a healthy weight. A healthy diet includes low-fat dairy products, low-fat (lean) meats, and fiber from whole grains, beans, and lots of fruits and vegetables. Home safety  Remove any tripping hazards, such as rugs, cords, and clutter.  Install safety equipment such as grab bars in bathrooms and safety rails on stairs.  Keep rooms and walkways well-lit. Activity   Follow a regular exercise program to stay fit. This will help you maintain your balance. Ask your health care provider what types of exercise are appropriate for you.  If you need a cane or  walker, use it as recommended by your health care provider.  Wear supportive shoes that have nonskid soles. Lifestyle  Do not drink alcohol if your health care provider tells you not to drink.  If you drink alcohol, limit how much you have: ? 0-1 drink a day for women. ? 0-2 drinks a day for men.  Be aware of how much alcohol is in your drink. In the [place] one drink equals one typical bottle of beer (12  oz), one-half glass of wine (5 oz), or one shot of hard liquor (1 oz).  Do not use any products that contain nicotine or tobacco, such as cigarettes and e-cigarettes. If you need help quitting, ask your health care provider. Summary  Having a healthy lifestyle and getting preventive care can help to protect your health and wellness after age 65.  Screening and testing are the best way to find a health problem early and help you avoid having a fall. Early diagnosis and treatment give you the best chance for managing medical conditions that are more common for people who are older than age 65.  Falls are a major cause of broken bones and head injuries in people who are older than age 65. Take precautions to prevent a fall at home.  Work with your health care provider to learn what changes you can make to improve your health and wellness and to prevent falls. This information is not intended to replace advice given to you by your health care provider. Make sure you discuss any questions you have with your health care provider. Document Revised: 03/05/2019 Document Reviewed: 09/25/2017 Elsevier Patient Education  2020 [employer]  Preventive Care 65 Years and Older, Male Preventive care refers to lifestyle choices and visits with your health care provider that can promote health and wellness. This includes:  A yearly physical exam. This is also called an annual well check.  Regular dental and eye exams.  Immunizations.  Screening for certain conditions.  Healthy lifestyle choices, such as diet and exercise. What can I expect for my preventive care visit? Physical exam Your health care provider will check:  Height and weight. These may be used to calculate body mass index (BMI), which is a measurement that tells if you are at a healthy weight.  Heart rate and blood pressure.  Your skin for abnormal spots. Counseling Your health care provider may ask you questions  about:  Alcohol, tobacco, and drug use.  Emotional well-being.  Home and relationship well-being.  Sexual activity.  Eating habits.  History of falls.  Memory and ability to understand (cognition).  Work and work environment. What immunizations do I need?  Influenza (flu) vaccine  This is recommended every year. Tetanus, diphtheria, and pertussis (Tdap) vaccine  You may need a Td booster every 10 years. Varicella (chickenpox) vaccine  You may need this vaccine if you have not already been vaccinated. Zoster (shingles) vaccine  You may need this after age 60. Pneumococcal conjugate (PCV13) vaccine  One dose is recommended after age 65. Pneumococcal polysaccharide (PPSV23) vaccine  One dose is recommended after age 65. Measles, mumps, and rubella (MMR) vaccine  You may need at least one dose of MMR if you were born in 1957 or later. You may also need a second dose. Meningococcal conjugate (MenACWY) vaccine  You may need this if you have certain conditions. Hepatitis A vaccine  You may need this if you have certain conditions or if you travel or work in places where   you may be exposed to hepatitis A. Hepatitis B vaccine  You may need this if you have certain conditions or if you travel or work in places where you may be exposed to hepatitis B. Haemophilus influenzae type b (Hib) vaccine  You may need this if you have certain conditions. You may receive vaccines as individual doses or as more than one vaccine together in one shot (combination vaccines). Talk with your health care provider about the risks and benefits of combination vaccines. What tests do I need? Blood tests  Lipid and cholesterol levels. These may be checked every 5 years, or more frequently depending on your overall health.  Hepatitis C test.  Hepatitis B test. Screening  Lung cancer screening. You may have this screening every year starting at age 73 if you have a 30-pack-year history of  smoking and currently smoke or have quit within the past 15 years.  Colorectal cancer screening. All adults should have this screening starting at age 38 and continuing until age 21. Your health care provider may recommend screening at age 48 if you are at increased risk. You will have tests every 1-10 years, depending on your results and the type of screening test.  Prostate cancer screening. Recommendations will vary depending on your family history and other risks.  Diabetes screening. This is done by checking your blood sugar (glucose) after you have not eaten for a while (fasting). You may have this done every 1-3 years.  Abdominal aortic aneurysm (AAA) screening. You may need this if you are a current or former smoker.  Sexually transmitted disease (STD) testing. Follow these instructions at home: Eating and drinking  Eat a diet that includes fresh fruits and vegetables, whole grains, lean protein, and low-fat dairy products. Limit your intake of foods with high amounts of sugar, saturated fats, and salt.  Take vitamin and mineral supplements as recommended by your health care provider.  Do not drink alcohol if your health care provider tells you not to drink.  If you drink alcohol: ? Limit how much you have to 0-2 drinks a day. ? Be aware of how much alcohol is in your drink. In the [place] one drink equals one 12 oz bottle of beer (355 mL), one 5 oz glass of wine (148 mL), or one 1 oz glass of hard liquor (44 mL). Lifestyle  Take daily care of your teeth and gums.  Stay active. Exercise for at least 30 minutes on 5 or more days each week.  Do not use any products that contain nicotine or tobacco, such as cigarettes, e-cigarettes, and chewing tobacco. If you need help quitting, ask your health care provider.  If you are sexually active, practice safe sex. Use a condom or other form of protection to prevent STIs (sexually transmitted infections).  Talk with your health care  provider about taking a low-dose aspirin or statin. What's next?  Visit your health care provider once a year for a well check visit.  Ask your health care provider how often you should have your eyes and teeth checked.  Stay up to date on all vaccines. This information is not intended to replace advice given to you by your health care provider. Make sure you discuss any questions you have with your health care provider. Document Revised: 11/06/2018 Document Reviewed: 11/06/2018 Elsevier Patient Education  2020 [employer].  Exercising to Lose Weight Exercise is structured, repetitive physical activity to improve fitness and health. Getting regular exercise is important for everyone.  It is especially important if you are overweight. Being overweight increases your risk of heart disease, stroke, diabetes, high blood pressure, and several types of cancer. Reducing your calorie intake and exercising can help you lose weight. Exercise is usually categorized as moderate or vigorous intensity. To lose weight, most people need to do a certain amount of moderate-intensity or vigorous-intensity exercise each week. Moderate-intensity exercise  Moderate-intensity exercise is any activity that gets you moving enough to burn at least three times more energy (calories) than if you were sitting. Examples of moderate exercise include:  Walking a mile in 15 minutes.  Doing light yard work.  Biking at an easy pace. Most people should get at least 150 minutes (2 hours and 30 minutes) a week of moderate-intensity exercise to maintain their body weight. Vigorous-intensity exercise Vigorous-intensity exercise is any activity that gets you moving enough to burn at least six times more calories than if you were sitting. When you exercise at this intensity, you should be working hard enough that you are not able to carry on a conversation. Examples of vigorous exercise include:  Running.  Playing a team  sport, such as football, basketball, and soccer.  Jumping rope. Most people should get at least 75 minutes (1 hour and 15 minutes) a week of vigorous-intensity exercise to maintain their body weight. How can exercise affect me? When you exercise enough to burn more calories than you eat, you lose weight. Exercise also reduces body fat and builds muscle. The more muscle you have, the more calories you burn. Exercise also:  Improves mood.  Reduces stress and tension.  Improves your overall fitness, flexibility, and endurance.  Increases bone strength. The amount of exercise you need to lose weight depends on:  Your age.  The type of exercise.  Any health conditions you have.  Your overall physical ability. Talk to your health care provider about how much exercise you need and what types of activities are safe for you. What actions can I take to lose weight? Nutrition   Make changes to your diet as told by your health care provider or diet and nutrition specialist (dietitian). This may include: ? Eating fewer calories. ? Eating more protein. ? Eating less unhealthy fats. ? Eating a diet that includes fresh fruits and vegetables, whole grains, low-fat dairy products, and lean protein. ? Avoiding foods with added fat, salt, and sugar.  Drink plenty of water while you exercise to prevent dehydration or heat stroke. Activity  Choose an activity that you enjoy and set realistic goals. Your health care provider can help you make an exercise plan that works for you.  Exercise at a moderate or vigorous intensity most days of the week. ? The intensity of exercise may vary from person to person. You can tell how intense a workout is for you by paying attention to your breathing and heartbeat. Most people will notice their breathing and heartbeat get faster with more intense exercise.  Do resistance training twice each week, such as: ? Push-ups. ? Sit-ups. ? Lifting weights. ? Using  resistance bands.  Getting short amounts of exercise can be just as helpful as long structured periods of exercise. If you have trouble finding time to exercise, try to include exercise in your daily routine. ? Get up, stretch, and walk around every 30 minutes throughout the day. ? Go for a walk during your lunch break. ? Park your car farther away from your destination. ? If you take public transportation, get  off one stop early and walk the rest of the way. ? Make phone calls while standing up and walking around. ? Take the stairs instead of elevators or escalators.  Wear comfortable clothes and shoes with good support.  Do not exercise so much that you hurt yourself, feel dizzy, or get very short of breath. Where to find more information  [place] Department of Health and Human Services: [employer]  Centers for Disease Control and Prevention (CDC): http://www.wolf.info/ Contact a health care provider:  Before starting a new exercise program.  If you have questions or concerns about your weight.  If you have a medical problem that keeps you from exercising. Get help right away if you have any of the following while exercising:  Injury.  Dizziness.  Difficulty breathing or shortness of breath that does not go away when you stop exercising.  Chest pain.  Rapid heartbeat. Summary  Being overweight increases your risk of heart disease, stroke, diabetes, high blood pressure, and several types of cancer.  Losing weight happens when you burn more calories than you eat.  Reducing the amount of calories you eat in addition to getting regular moderate or vigorous exercise each week helps you lose weight. This information is not intended to replace advice given to you by your health care provider. Make sure you discuss any questions you have with your health care provider. Document Revised: 11/25/2017 Document Reviewed: 11/25/2017 Elsevier Patient Education  2020 [employer].

## 2020-04-04 NOTE — Progress Notes (Addendum)
Established Patient Office Visit  Subjective:  Patient ID: Shane Garrett, male    DOB: 08/12/1956  Age: 64 y.o. MRN: [id number]  CC:  Chief Complaint  Patient presents with  . Follow-up    6 month follow up on diabetes, no concerns.     HPI Shane Garrett presents for follow-up of his hypertension, elevated cholesterol, coronary artery disease, diabetes and a physical exam.  Has been taking his Crestor daily.  He is not exercising on a regular basis.  He is retired and lives with his wife who happens to be down in [place] caring for her aged parents.  He is between cardiologist and is seeking his own.  Status post recent colonoscopy.  He did have an eye check this past month.  He is having regular dental care.  Has not had a Covid vaccine because he actually had the illness March a year ago.  He does not smoke drink alcohol or use illicit drugs.  Urine flow has been okay for him.  Anxiety well controlled with citalopram.  Rarely if ever uses clonazepam.  Past Medical History:  Diagnosis Date  . Anxiety   . Diabetes mellitus without complication ([place])   . Hypertension     Past Surgical History:  Procedure Laterality Date  . EXPLORATION POST OPERATIVE OPEN HEART      Family History  Problem Relation Age of Onset  . Cancer Mother   . Diabetes Brother     Social History   Socioeconomic History  . Marital status: Married    Spouse name: Not on file  . Number of children: Not on file  . Years of education: Not on file  . Highest education level: Not on file  Occupational History  . Not on file  Tobacco Use  . Smoking status: Never Smoker  . Smokeless tobacco: Never Used  Substance and Sexual Activity  . Alcohol use: Never  . Drug use: Never  . Sexual activity: Not on file  Other Topics Concern  . Not on file  Social History Narrative  . Not on file   Social Determinants of Health   Financial Resource Strain:   . Difficulty of Paying Living Expenses:   Food  Insecurity:   . Worried About Charity fundraiser in the Last Year:   . Arboriculturist in the Last Year:   Transportation Needs:   . Film/video editor (Medical):   Marland Kitchen Lack of Transportation (Non-Medical):   Physical Activity:   . Days of Exercise per Week:   . Minutes of Exercise per Session:   Stress:   . Feeling of Stress :   Social Connections:   . Frequency of Communication with Friends and Family:   . Frequency of Social Gatherings with Friends and Family:   . Attends Religious Services:   . Active Member of Clubs or Organizations:   . Attends Archivist Meetings:   Marland Kitchen Marital Status:   Intimate Partner Violence:   . Fear of Current or Ex-Partner:   . Emotionally Abused:   Marland Kitchen Physically Abused:   . Sexually Abused:     Outpatient Medications Prior to Visit  Medication Sig Dispense Refill  . aspirin EC 81 MG tablet Take by mouth.    . citalopram (CELEXA) 40 MG tablet Take 1 tablet by mouth once daily 90 tablet 0  . clonazePAM (KLONOPIN) 0.5 MG tablet Take 1 tablet (0.5 mg total) by mouth daily as needed for  anxiety. 30 tablet 0  . colchicine 0.6 MG tablet Take by mouth.    . isosorbide mononitrate (IMDUR) 30 MG 24 hr tablet Take by mouth.    . metFORMIN (GLUCOPHAGE-XR) 500 MG 24 hr tablet Take 1 tablet (500 mg total) by mouth daily with breakfast. 90 tablet 1  . metoprolol succinate (TOPROL-XL) 50 MG 24 hr tablet Take by mouth.    . ramipril (ALTACE) 10 MG capsule Take by mouth.    . rosuvastatin (CRESTOR) 40 MG tablet TAKE 1 TABLET BY MOUTH DAILY 90 tablet 1  . nitroGLYCERIN (NITROSTAT) 0.4 MG SL tablet Place under the tongue.    Marland Kitchen olopatadine (PATANOL) 0.1 % ophthalmic solution 1 drop as needed.     No facility-administered medications prior to visit.    No Known Allergies  ROS Review of Systems  Constitutional: Negative.   HENT: Negative.   Eyes: Negative for photophobia and visual disturbance.  Respiratory: Negative.  Negative for chest tightness  and shortness of breath.   Cardiovascular: Negative.  Negative for chest pain and palpitations.  Gastrointestinal: Negative.   Endocrine: Negative for polyphagia and polyuria.  Genitourinary: Negative.  Negative for decreased urine volume, difficulty urinating and discharge.  Musculoskeletal: Negative for gait problem and joint swelling.  Skin: Negative for pallor and rash.  Allergic/Immunologic: Negative for immunocompromised state.  Neurological: Negative for tremors and speech difficulty.  Hematological: Does not bruise/bleed easily.  Psychiatric/Behavioral: Negative.    Check Depression screen [facility] 2/9 04/04/2020 10/05/2019  Decreased Interest 0 0  Down, Depressed, Hopeless 0 0  PHQ - 2 Score 0 0  Altered sleeping 0 -  Tired, decreased energy 1 -  Change in appetite 1 -  Feeling bad or failure about yourself  0 -  Trouble concentrating 0 -  Moving slowly or fidgety/restless 0 -  Suicidal thoughts 0 -  PHQ-9 Score 2 -  Difficult doing work/chores Not difficult at all -      Objective:    Physical Exam  Constitutional: He is oriented to person, place, and time. He appears well-developed and well-nourished. No distress.  HENT:  Head: Normocephalic and atraumatic.  Right Ear: External ear normal.  Left Ear: External ear normal.  Eyes: Pupils are equal, round, and reactive to light. Conjunctivae and EOM are normal. Right eye exhibits no discharge. Left eye exhibits no discharge. No scleral icterus.  Neck: No JVD present. No tracheal deviation present. No thyromegaly present.  Cardiovascular: Normal rate, regular rhythm and normal heart sounds.  Pulses:      Dorsalis pedis pulses are 2+ on the right side and 2+ on the left side.       Posterior tibial pulses are 2+ on the right side and 2+ on the left side.  Pulmonary/Chest: Effort normal and breath sounds normal. No stridor.  Abdominal: Bowel sounds are normal. He exhibits no distension. There is no abdominal tenderness. There  is no rebound and no guarding.  Genitourinary: Rectum:     Guaiac result negative.     No rectal mass, anal fissure, tenderness, external hemorrhoid, internal hemorrhoid or abnormal anal tone.  Prostate is enlarged. Prostate is not tender.  Musculoskeletal:        General: No edema.     Cervical back: Neck supple.  Lymphadenopathy:    He has no cervical adenopathy.  Neurological: He is alert and oriented to person, place, and time.  Skin: Skin is warm and dry. He is not diaphoretic.     Psychiatric: He  has a normal mood and affect.   Diabetic Foot Exam - Simple   Simple Foot Form Visual Inspection No deformities, no ulcerations, no other skin breakdown bilaterally: Yes Sensation Testing Intact to touch and monofilament testing bilaterally: Yes Pulse Check Posterior Tibialis and Dorsalis pulse intact bilaterally: Yes Comments    BP 134/80   Pulse 68   Temp (!) 96.8 F (36 C) (Tympanic)   Ht 6\' 1"  (1.854 m)   Wt 262 lb 3.2 oz (118.9 kg)   SpO2 95%   BMI 34.59 kg/m  Wt Readings from Last 3 Encounters:  04/04/20 262 lb 3.2 oz (118.9 kg)  10/05/19 259 lb 6.4 oz (117.7 kg)     Health Maintenance Due  Topic Date Due  . COVID-19 Vaccine (1) Never done  . TETANUS/TDAP  Never done    There are no preventive care reminders to display for this patient.  No results found for: TSH No results found for: WBC, HGB, HCT, MCV, PLT Lab Results  Component Value Date   NA 141 10/12/2019   K 4.1 10/12/2019   CO2 27 10/12/2019   GLUCOSE 125 (H) 10/12/2019   BUN 16 10/12/2019   CREATININE 1.11 10/12/2019   BILITOT 0.4 10/12/2019   ALKPHOS 68 10/12/2019   AST 18 10/12/2019   ALT 20 10/12/2019   PROT 7.0 10/12/2019   ALBUMIN 4.1 10/12/2019   CALCIUM 9.7 10/12/2019   GFR 66.35 10/12/2019   Lab Results  Component Value Date   CHOL 167 10/12/2019   Lab Results  Component Value Date   HDL 38.60 (L) 10/12/2019   Lab Results  Component Value Date   LDLCALC 88 10/12/2019     Lab Results  Component Value Date   TRIG 198.0 (H) 10/12/2019   Lab Results  Component Value Date   CHOLHDL 4 10/12/2019   Lab Results  Component Value Date   HGBA1C 7.0 (H) 10/12/2019      Assessment & Plan:   Problem List Items Addressed This Visit      Cardiovascular and Mediastinum   Coronary artery disease of native artery of native heart with stable angina pectoris (HCC)   Relevant Medications   ezetimibe (ZETIA) 10 MG tablet   Other Relevant Orders   CBC   Comprehensive metabolic panel   LDL cholesterol, direct   Lipid panel   Essential hypertension   Relevant Medications   ezetimibe (ZETIA) 10 MG tablet   Other Relevant Orders   CBC   Comprehensive metabolic panel   Urinalysis, Routine w reflex microscopic     Endocrine   Type 2 diabetes mellitus without complication, without long-term current use of insulin (HCC) - Primary   Relevant Orders   CBC   Comprehensive metabolic panel   Microalbumin / creatinine urine ratio     Other   Anxiety   Hyperlipidemia   Relevant Medications   ezetimibe (ZETIA) 10 MG tablet   Other Relevant Orders   CBC   Comprehensive metabolic panel   LDL cholesterol, direct   Lipid panel   Inclusion cyst   Healthcare maintenance   Relevant Orders   PSA      Meds ordered this encounter  Medications  . ezetimibe (ZETIA) 10 MG tablet    Sig: Take 1 tablet (10 mg total) by mouth daily.    Dispense:  90 tablet    Refill:  3    Follow-up: Return in about 6 months (around 10/05/2020), or return fasting for blood work.13/08/2020  Have added Zetia to his Crestor.  Encouraged exercise and weight loss.  Was given information on health maintenance disease prevention as well as exercising to lose weight.  Will help him find a cardiologist if he is not done so by his next visit.  Urged patient to go ahead and have the Covid vaccine. Mliss Sax, MD

## 2020-04-14 ENCOUNTER — Other Ambulatory Visit: Payer: PPO

## 2020-04-14 ENCOUNTER — Ambulatory Visit (INDEPENDENT_AMBULATORY_CARE_PROVIDER_SITE_OTHER): Payer: PPO | Admitting: Family Medicine

## 2020-04-14 ENCOUNTER — Encounter: Payer: Self-pay | Admitting: Family Medicine

## 2020-04-14 VITALS — BP 132/84 | HR 54 | Temp 96.8°F | Ht 73.0 in | Wt 258.4 lb

## 2020-04-14 DIAGNOSIS — E785 Hyperlipidemia, unspecified: Secondary | ICD-10-CM | POA: Diagnosis not present

## 2020-04-14 DIAGNOSIS — I25118 Atherosclerotic heart disease of native coronary artery with other forms of angina pectoris: Secondary | ICD-10-CM | POA: Diagnosis not present

## 2020-04-14 DIAGNOSIS — I10 Essential (primary) hypertension: Secondary | ICD-10-CM | POA: Diagnosis not present

## 2020-04-14 DIAGNOSIS — Z0000 Encounter for general adult medical examination without abnormal findings: Secondary | ICD-10-CM

## 2020-04-14 DIAGNOSIS — L720 Epidermal cyst: Secondary | ICD-10-CM | POA: Diagnosis not present

## 2020-04-14 DIAGNOSIS — E119 Type 2 diabetes mellitus without complications: Secondary | ICD-10-CM | POA: Diagnosis not present

## 2020-04-14 LAB — URINALYSIS, ROUTINE W REFLEX MICROSCOPIC
Bilirubin Urine: NEGATIVE
Hgb urine dipstick: NEGATIVE
Ketones, ur: NEGATIVE
Leukocytes,Ua: NEGATIVE
Nitrite: NEGATIVE
RBC / HPF: NONE SEEN (ref 0–?)
Specific Gravity, Urine: 1.02 (ref 1.000–1.030)
Total Protein, Urine: NEGATIVE
Urine Glucose: NEGATIVE
Urobilinogen, UA: 0.2 (ref 0.0–1.0)
WBC, UA: NONE SEEN (ref 0–?)
pH: 5.5 (ref 5.0–8.0)

## 2020-04-14 LAB — COMPREHENSIVE METABOLIC PANEL
ALT: 18 U/L (ref 0–53)
AST: 15 U/L (ref 0–37)
Albumin: 4.2 g/dL (ref 3.5–5.2)
Alkaline Phosphatase: 58 U/L (ref 39–117)
BUN: 14 mg/dL (ref 6–23)
CO2: 25 mEq/L (ref 19–32)
Calcium: 9.6 mg/dL (ref 8.4–10.5)
Chloride: 109 mEq/L (ref 96–112)
Creatinine, Ser: 1.11 mg/dL (ref 0.40–1.50)
GFR: 66.24 mL/min (ref >60.00)
Glucose, Bld: 116 mg/dL — ABNORMAL HIGH (ref 70–99)
Potassium: 4.1 mEq/L (ref 3.5–5.1)
Sodium: 140 mEq/L (ref 135–145)
Total Bilirubin: 0.7 mg/dL (ref 0.2–1.2)
Total Protein: 6.7 g/dL (ref 6.0–8.3)

## 2020-04-14 LAB — CBC
HCT: 41.8 % (ref 39.0–52.0)
Hemoglobin: 14.1 g/dL (ref 13.0–17.0)
MCHC: 33.6 g/dL (ref 30.0–36.0)
MCV: 90.4 fl (ref 78.0–100.0)
Platelets: 177 10*3/uL (ref 150.0–400.0)
RBC: 4.63 Mil/uL (ref 4.22–5.81)
RDW: 14.5 % (ref 11.5–15.5)
WBC: 7.8 10*3/uL (ref 4.0–10.5)

## 2020-04-14 LAB — MICROALBUMIN / CREATININE URINE RATIO
Creatinine,U: 90.3 mg/dL
Microalb Creat Ratio: 0.8 mg/g (ref 0.0–30.0)
Microalb, Ur: <0.7 mg/dL (ref 0.0–1.9)

## 2020-04-14 LAB — LIPID PANEL
Cholesterol: 127 mg/dL (ref 0–200)
HDL: 35.5 mg/dL — ABNORMAL LOW (ref >39.00)
LDL Cholesterol: 63 mg/dL (ref 0–99)
NonHDL: 91.08
Total CHOL/HDL Ratio: 4
Triglycerides: 140 mg/dL (ref 0.0–149.0)
VLDL: 28 mg/dL (ref 0.0–40.0)

## 2020-04-14 LAB — LDL CHOLESTEROL, DIRECT: Direct LDL: 67 mg/dL

## 2020-04-14 LAB — PSA: PSA: 2.73 ng/mL (ref 0.10–4.00)

## 2020-04-14 NOTE — Patient Instructions (Signed)
Epidermal Cyst  An epidermal cyst is a sac made of skin tissue. The sac contains a substance called keratin. Keratin is a protein that is normally secreted through the hair follicles. When keratin becomes trapped in the top layer of skin (epidermis), it can form an epidermal cyst. Epidermal cysts can be found anywhere on your body. These cysts are usually harmless (benign), and they may not cause symptoms unless they become infected. What are the causes? This condition may be caused by:  A blocked hair follicle.  A hair that curls and re-enters the skin instead of growing straight out of the skin (ingrown hair).  A blocked pore.  Irritated skin.  An injury to the skin.  Certain conditions that are passed along from parent to child (inherited).  Human papillomavirus (HPV).  Long-term (chronic) sun damage to the skin. What increases the risk? The following factors may make you more likely to develop an epidermal cyst:  Having acne.  Being overweight.  Being 30-40 years old. What are the signs or symptoms? The only symptom of this condition may be a small, painless lump underneath the skin. When an epidermal cyst ruptures, it may become infected. Symptoms may include:  Redness.  Inflammation.  Tenderness.  Warmth.  Fever.  Keratin draining from the cyst. Keratin is grayish-white, bad-smelling substance.  Pus draining from the cyst. How is this diagnosed? This condition is diagnosed with a physical exam.  In some cases, you may have a sample of tissue (biopsy) taken from your cyst to be examined under a microscope or tested for bacteria.  You may be referred to a health care provider who specializes in skin care (dermatologist). How is this treated? In many cases, epidermal cysts go away on their own without treatment. If a cyst becomes infected, treatment may include:  Opening and draining the cyst, done by a health care provider. After draining, minor surgery to  remove the rest of the cyst may be done.  Antibiotic medicine.  Injections of medicines (steroids) that help to reduce inflammation.  Surgery to remove the cyst. Surgery may be done if the cyst: ? Becomes large. ? Bothers you. ? Has a chance of turning into cancer.  Do not try to open a cyst yourself. Follow these instructions at home:  Take over-the-counter and prescription medicines only as told by your health care provider.  If you were prescribed an antibiotic medicine, take it it as told by your health care provider. Do not stop using the antibiotic even if you start to feel better.  Keep the area around your cyst clean and dry.  Wear loose, dry clothing.  Avoid touching your cyst.  Check your cyst every day for signs of infection. Check for: ? Redness, swelling, or pain. ? Fluid or blood. ? Warmth. ? Pus or a bad smell.  Keep all follow-up visits as told by your health care provider. This is important. How is this prevented?  Wear clean, dry, clothing.  Avoid wearing tight clothing.  Keep your skin clean and dry. Take showers or baths every day. Contact a health care provider if:  Your cyst develops symptoms of infection.  Your condition is not improving or is getting worse.  You develop a cyst that looks different from other cysts you have had.  You have a fever. Get help right away if:  Redness spreads from the cyst into the surrounding area. Summary  An epidermal cyst is a sac made of skin tissue. These cysts are   usually harmless (benign), and they may not cause symptoms unless they become infected.  If a cyst becomes infected, treatment may include surgery to open and drain the cyst, or to remove it. Treatment may also include medicines by mouth or through an injection.  Take over-the-counter and prescription medicines only as told by your health care provider. If you were prescribed an antibiotic medicine, take it as told by your health care  provider. Do not stop using the antibiotic even if you start to feel better.  Contact a health care provider if your condition is not improving or is getting worse.  Keep all follow-up visits as told by your health care provider. This is important. This information is not intended to replace advice given to you by your health care provider. Make sure you discuss any questions you have with your health care provider. Document Revised: 03/05/2019 Document Reviewed: 05/26/2018 Elsevier Patient Education  [place].  Incision and Drainage, Care After This sheet gives you information about how to care for yourself after your procedure. Your health care provider may also give you more specific instructions. If you have problems or questions, contact your health care provider. What can I expect after the procedure? After the procedure, it is common to have:  Pain or discomfort around the incision site.  Blood, fluid, or pus (drainage) from the incision.  Redness and firm skin around the incision site. Follow these instructions at home: Medicines  Take over-the-counter and prescription medicines only as told by your health care provider.  If you were prescribed an antibiotic medicine, use or take it as told by your health care provider. Do not stop using the antibiotic even if you start to feel better. Wound care Follow instructions from your health care provider about how to take care of your wound. Make sure you:  Wash your hands with soap and water before and after you change your bandage (dressing). If soap and water are not available, use hand sanitizer.  Change your dressing and packing as told by your health care provider. ? If your dressing is dry or stuck when you try to remove it, moisten or wet the dressing with saline or water so that it can be removed without harming your skin or tissues. ? If your wound is packed, leave it in place until your health care provider tells  you to remove it. To remove the packing, moisten or wet the packing with saline or water so that it can be removed without harming your skin or tissues.  Leave stitches (sutures), skin glue, or adhesive strips in place. These skin closures may need to stay in place for 2 weeks or longer. If adhesive strip edges start to loosen and curl up, you may trim the loose edges. Do not remove adhesive strips completely unless your health care provider tells you to do that. Check your wound every day for signs of infection. Check for:  More redness, swelling, or pain.  More fluid or blood.  Warmth.  Pus or a bad smell. If you were sent home with a drain tube in place, follow instructions from your health care provider about:  How to empty it.  How to care for it at home.  General instructions  Rest the affected area.  Do not take baths, swim, or use a hot tub until your health care provider approves. Ask your health care provider if you may take showers. You may only be allowed to take sponge baths.  Return to your normal activities as told by your health care provider. Ask your health care provider what activities are safe for you. Your health care provider may put you on activity or lifting restrictions.  The incision will continue to drain. It is normal to have some clear or slightly bloody drainage. The amount of drainage should lessen each day.  Do not apply any creams, ointments, or liquids unless you have been told to by your health care provider.  Keep all follow-up visits as told by your health care provider. This is important. Contact a health care provider if:  Your cyst or abscess returns.  You have a fever or chills.  You have more redness, swelling, or pain around your incision.  You have more fluid or blood coming from your incision.  Your incision feels warm to the touch.  You have pus or a bad smell coming from your incision.  You have red streaks above or below the  incision site. Get help right away if:  You have severe pain or bleeding.  You cannot eat or drink without vomiting.  You have decreased urine output.  You become short of breath.  You have chest pain.  You cough up blood.  The affected area becomes numb or starts to tingle. These symptoms may represent a serious problem that is an emergency. Do not wait to see if the symptoms will go away. Get medical help right away. Call your local emergency services (911 in the [place] Do not drive yourself to the hospital. Summary  After this procedure, it is common to have fluid, blood, or pus coming from the surgery site.  Follow all home care instructions. You will be told how to take care of your incision, how to check for infection, and how to take medicines.  If you were prescribed an antibiotic medicine, take it as told by your health care provider. Do not stop taking the antibiotic even if you start to feel better.  Contact a health care provider if you have increased redness, swelling, or pain around your incision. Get help right away if you have chest pain, you vomit, you cough up blood, or you have shortness of breath.  Keep all follow-up visits as told by your health care provider. This is important. This information is not intended to replace advice given to you by your health care provider. Make sure you discuss any questions you have with your health care provider. Document Revised: 10/13/2018 Document Reviewed: 10/13/2018 Elsevier Patient Education  2020 [employer].

## 2020-04-14 NOTE — Progress Notes (Signed)
Established Patient Office Visit  Subjective:  Patient ID: Shane Garrett, male    DOB: 1956/06/06  Age: 64 y.o. MRN: [id number]  CC:  Chief Complaint  Patient presents with  . Cyst    remove cyst on left side of head.     HPI [name] presents for excision and removal of inclusion cyst of scalp.  He is also fasting today for his ordered blood work.  He is doing well and having no fevers or chills and feels well today.  Past Medical History:  Diagnosis Date  . Anxiety   . Diabetes mellitus without complication ([place])   . Hypertension     Past Surgical History:  Procedure Laterality Date  . EXPLORATION POST OPERATIVE OPEN HEART      Family History  Problem Relation Age of Onset  . Cancer Mother   . Diabetes Brother     Social History   Socioeconomic History  . Marital status: Married    Spouse name: Not on file  . Number of children: Not on file  . Years of education: Not on file  . Highest education level: Not on file  Occupational History  . Not on file  Tobacco Use  . Smoking status: Never Smoker  . Smokeless tobacco: Never Used  Substance and Sexual Activity  . Alcohol use: Never  . Drug use: Never  . Sexual activity: Not on file  Other Topics Concern  . Not on file  Social History Narrative  . Not on file   Social Determinants of Health   Financial Resource Strain:   . Difficulty of Paying Living Expenses:   Food Insecurity:   . Worried About Charity fundraiser in the Last Year:   . Arboriculturist in the Last Year:   Transportation Needs:   . Film/video editor (Medical):   Marland Kitchen Lack of Transportation (Non-Medical):   Physical Activity:   . Days of Exercise per Week:   . Minutes of Exercise per Session:   Stress:   . Feeling of Stress :   Social Connections:   . Frequency of Communication with Friends and Family:   . Frequency of Social Gatherings with Friends and Family:   . Attends Religious Services:   . Active Member of Clubs  or Organizations:   . Attends Archivist Meetings:   Marland Kitchen Marital Status:   Intimate Partner Violence:   . Fear of Current or Ex-Partner:   . Emotionally Abused:   Marland Kitchen Physically Abused:   . Sexually Abused:     Outpatient Medications Prior to Visit  Medication Sig Dispense Refill  . aspirin EC 81 MG tablet Take by mouth.    . citalopram (CELEXA) 40 MG tablet Take 1 tablet by mouth once daily 90 tablet 0  . clonazePAM (KLONOPIN) 0.5 MG tablet Take 1 tablet (0.5 mg total) by mouth daily as needed for anxiety. 30 tablet 0  . colchicine 0.6 MG tablet Take by mouth.    . ezetimibe (ZETIA) 10 MG tablet Take 1 tablet (10 mg total) by mouth daily. 90 tablet 3  . isosorbide mononitrate (IMDUR) 30 MG 24 hr tablet Take by mouth.    . metFORMIN (GLUCOPHAGE-XR) 500 MG 24 hr tablet Take 1 tablet (500 mg total) by mouth daily with breakfast. 90 tablet 1  . metoprolol succinate (TOPROL-XL) 50 MG 24 hr tablet Take by mouth.    Marland Kitchen olopatadine (PATANOL) 0.1 % ophthalmic solution 1 drop as  needed.    . ramipril (ALTACE) 10 MG capsule Take by mouth.    . rosuvastatin (CRESTOR) 40 MG tablet TAKE 1 TABLET BY MOUTH DAILY 90 tablet 1  . nitroGLYCERIN (NITROSTAT) 0.4 MG SL tablet Place under the tongue.     No facility-administered medications prior to visit.    No Known Allergies  ROS Review of Systems  Constitutional: Negative.   HENT: Negative.   Respiratory: Negative.   Cardiovascular: Negative.   Gastrointestinal: Negative.   Skin: Negative for color change and pallor.  Neurological: Negative.   Psychiatric/Behavioral: Negative.       Objective:    Physical Exam  Constitutional: He is oriented to person, place, and time. He appears well-developed and well-nourished. No distress.  Eyes: Conjunctivae are normal. Right eye exhibits no discharge. Left eye exhibits no discharge. No scleral icterus.  Pulmonary/Chest: Effort normal.  Neurological: He is alert and oriented to person, place,  and time.  Skin: Skin is warm and dry. He is not diaphoretic.     Psychiatric: He has a normal mood and affect. His behavior is normal.     Incision and Drainage Procedure Note  Pre-operative Diagnosis: inclusion cyst of scalp.   Post-operative Diagnosis: normal  Indications: enlarging  Anesthesia: 1% lidocaine with epinephrine x 2cc  Procedure Details  The procedure, risks and complications have been discussed in detail (including, but not limited to airway compromise, infection, bleeding) with the patient, and the patient has signed consent to the procedure.  The skin was sterilely prepped and draped over the affected area in the usual fashion. After adequate local anesthesia, I&D with a #11 blade was performed on the midline of the cyst. Purulent drainage: present Cyst wall was removed intact.  The patient was observed until stable.  Findings: Inclusion cyst.   EBL:0cc's  Drains: none.  Condition: Tolerated procedure well   Complications: none.    BP 132/84   Pulse (!) 54   Temp (!) 96.8 F (36 C) (Tympanic)   Ht 6\' 1"  (1.854 m)   Wt 258 lb 6.4 oz (117.2 kg)   SpO2 96%   BMI 34.09 kg/m  Wt Readings from Last 3 Encounters:  04/14/20 258 lb 6.4 oz (117.2 kg)  04/04/20 262 lb 3.2 oz (118.9 kg)  10/05/19 259 lb 6.4 oz (117.7 kg)     Health Maintenance Due  Topic Date Due  . COVID-19 Vaccine (1) Never done  . TETANUS/TDAP  Never done  . HEMOGLOBIN A1C  04/10/2020  . FOOT EXAM  04/13/2020  . PNA vac Low Risk Adult (2 of 2 - PPSV23) 04/13/2020    There are no preventive care reminders to display for this patient.  No results found for: TSH No results found for: WBC, HGB, HCT, MCV, PLT Lab Results  Component Value Date   NA 141 10/12/2019   K 4.1 10/12/2019   CO2 27 10/12/2019   GLUCOSE 125 (H) 10/12/2019   BUN 16 10/12/2019   CREATININE 1.11 10/12/2019   BILITOT 0.4 10/12/2019   ALKPHOS 68 10/12/2019   AST 18 10/12/2019   ALT 20 10/12/2019    PROT 7.0 10/12/2019   ALBUMIN 4.1 10/12/2019   CALCIUM 9.7 10/12/2019   GFR 66.35 10/12/2019   Lab Results  Component Value Date   CHOL 167 10/12/2019   Lab Results  Component Value Date   HDL 38.60 (L) 10/12/2019   Lab Results  Component Value Date   LDLCALC 88 10/12/2019   Lab Results  Component Value Date   TRIG 198.0 (H) 10/12/2019   Lab Results  Component Value Date   CHOLHDL 4 10/12/2019   Lab Results  Component Value Date   HGBA1C 7.0 (H) 10/12/2019      Assessment & Plan:   Problem List Items Addressed This Visit      Cardiovascular and Mediastinum   Coronary artery disease of native artery of native heart with stable angina pectoris (HCC)   Essential hypertension     Endocrine   Type 2 diabetes mellitus without complication, without long-term current use of insulin (HCC)     Other   Hyperlipidemia   Inclusion cyst - Primary   Healthcare maintenance      No orders of the defined types were placed in this encounter.   Follow-up: No follow-ups on file.  Patient advised to keep the wound covered until sealed which is expected to happen in the next day or so.  He will return or be seen somewhere if discomfort drainage or fever develops.  Mliss Sax, MD

## 2020-05-16 ENCOUNTER — Telehealth: Payer: Self-pay | Admitting: Family Medicine

## 2020-05-16 NOTE — Telephone Encounter (Signed)
Patient calling for referral to cardiologist to follow up with him due to history of heart issues. Okay to refer? Please advise.

## 2020-05-16 NOTE — Telephone Encounter (Signed)
Okay to refer? 

## 2020-05-16 NOTE — Telephone Encounter (Signed)
Patient would like a referral for a Cardiologist. He stated reasons are just general because he is a heart patient.

## 2020-05-17 NOTE — Telephone Encounter (Signed)
Done/patient aware. 

## 2020-05-17 NOTE — Addendum Note (Signed)
Addended by: Lake Bells on: 05/17/2020 05:01 PM   Modules accepted: Orders

## 2020-05-24 ENCOUNTER — Other Ambulatory Visit: Payer: Self-pay

## 2020-05-24 ENCOUNTER — Telehealth: Payer: Self-pay | Admitting: Family Medicine

## 2020-05-24 MED ORDER — METOPROLOL SUCCINATE ER 50 MG PO TB24: 50.0000 mg | ORAL_TABLET | Freq: Every day | ORAL | 0 refills | Status: DC

## 2020-05-24 MED ORDER — ISOSORBIDE MONONITRATE ER 30 MG PO TB24: 30.0000 mg | ORAL_TABLET | Freq: Every day | ORAL | 0 refills | Status: DC

## 2020-05-24 NOTE — Telephone Encounter (Signed)
Patient calling for refill on pending medication last OV 04/04/20. Medication was not previously presicbed by Dr. Doreene Burke. Please advise.

## 2020-05-24 NOTE — Telephone Encounter (Signed)
Patient is calling for a refill for metoprolol succinate and isosorbide mononitrate sent to [facility] in [address] in [place]. CB is [phone]

## 2020-05-25 NOTE — Telephone Encounter (Signed)
Done

## 2020-05-31 ENCOUNTER — Telehealth: Payer: Self-pay | Admitting: Family Medicine

## 2020-05-31 NOTE — Telephone Encounter (Signed)
Patient called requesting a refill on his Ramipril. If approved, please send to [facility] in [facility] and call him at [phone] to let him know that it has been sent in.  Thank you, Shane Garrett

## 2020-06-01 NOTE — Telephone Encounter (Addendum)
Patient wife is calling to check the status of medication refill for ramipril. Wife stated that patient has been out of medication for a few days and she is getting worried for patient. CB is [phone]

## 2020-06-02 ENCOUNTER — Other Ambulatory Visit: Payer: Self-pay

## 2020-06-02 DIAGNOSIS — I10 Essential (primary) hypertension: Secondary | ICD-10-CM

## 2020-06-02 MED ORDER — RAMIPRIL 10 MG PO CAPS: 10.0000 mg | ORAL_CAPSULE | Freq: Every day | ORAL | 0 refills | Status: DC

## 2020-06-02 NOTE — Telephone Encounter (Signed)
Patient calling for refill on pending medication last OV 04/14/20 last refill on medication from previous provider June 2020. Please advise.

## 2020-06-02 NOTE — Telephone Encounter (Signed)
Rx sent in LM informing pt that it was sent in

## 2020-06-24 ENCOUNTER — Other Ambulatory Visit: Payer: Self-pay | Admitting: Family Medicine

## 2020-06-28 ENCOUNTER — Telehealth: Payer: Self-pay | Admitting: Family Medicine

## 2020-06-28 NOTE — Telephone Encounter (Signed)
Patient is calling and requesting a refill for Citalopram 90 day supply sent to [facility] on [address] in [place], please advise. CB is [phone]

## 2020-06-29 NOTE — Telephone Encounter (Signed)
Rx sent in

## 2020-08-12 ENCOUNTER — Ambulatory Visit: Payer: PPO | Admitting: Cardiovascular Disease

## 2020-08-22 ENCOUNTER — Ambulatory Visit: Payer: PPO | Admitting: Cardiovascular Disease

## 2020-08-23 ENCOUNTER — Other Ambulatory Visit: Payer: Self-pay | Admitting: Family Medicine

## 2020-08-23 DIAGNOSIS — I10 Essential (primary) hypertension: Secondary | ICD-10-CM

## 2020-09-14 ENCOUNTER — Other Ambulatory Visit: Payer: Self-pay | Admitting: Family Medicine

## 2020-09-22 ENCOUNTER — Other Ambulatory Visit: Payer: Self-pay | Admitting: Family Medicine

## 2020-09-26 ENCOUNTER — Encounter: Payer: Self-pay | Admitting: Cardiovascular Disease

## 2020-09-26 ENCOUNTER — Ambulatory Visit: Payer: PPO | Admitting: Cardiovascular Disease

## 2020-09-26 ENCOUNTER — Other Ambulatory Visit: Payer: Self-pay

## 2020-09-26 VITALS — BP 136/90 | HR 66 | Ht 73.0 in | Wt 263.8 lb

## 2020-09-26 DIAGNOSIS — I2510 Atherosclerotic heart disease of native coronary artery without angina pectoris: Secondary | ICD-10-CM

## 2020-09-26 DIAGNOSIS — I10 Essential (primary) hypertension: Secondary | ICD-10-CM | POA: Diagnosis not present

## 2020-09-26 NOTE — Progress Notes (Signed)
Chief Complaint  Patient presents with  . New Patient (Initial Visit)    CAD   History of Present Illness: 64 yo male with history of CAD s/p 2V CABG in 2012, anxiety, HTN, hyperlipidemia and DM who is here today as a new consult, referred by Dr. Doreene Burke, for evaluation of his CAD. He had a 2V CABG in 2012 in [place]. He has had no cardiac caths since his bypass surgery. He has been on Imdur. No recent chest pains. Admitted to [facility] in may 2020 with chest pain and had a normal nuclear stress test then. He saw Dr. Garner Nash in cardiology at [facility] once in July 2020. He has previously been followed by cardiology in [place], [place]. No complaints today. He denies any chest pain, dyspnea, palpitations, lower extremity edema, orthopnea, PND, dizziness, near syncope or syncope. He is retired. He enjoys playing golf and fishing.   Primary Care Physician: Mliss Sax, MD   Past Medical History:  Diagnosis Date  . Anxiety   . CAD (coronary artery disease)    2V CABG in 2012  . Diabetes mellitus without complication (HCC)   . Hyperlipidemia   . Hypertension     Past Surgical History:  Procedure Laterality Date  . CORONARY ARTERY BYPASS GRAFT     2V CABG in [place]    Current Outpatient Medications  Medication Sig Dispense Refill  . aspirin EC 81 MG tablet Take by mouth.    . citalopram (CELEXA) 40 MG tablet Take 1 tablet by mouth once daily 90 tablet 0  . clonazePAM (KLONOPIN) 0.5 MG tablet Take 1 tablet (0.5 mg total) by mouth daily as needed for anxiety. 30 tablet 0  . colchicine 0.6 MG tablet Take by mouth as needed.     . ezetimibe (ZETIA) 10 MG tablet Take 1 tablet (10 mg total) by mouth daily. 90 tablet 3  . isosorbide mononitrate (IMDUR) 30 MG 24 hr tablet Take 15 mg by mouth daily.    . metFORMIN (GLUCOPHAGE-XR) 500 MG 24 hr tablet Take 1 tablet by mouth once daily with breakfast 90 tablet 0  . metoprolol succinate (TOPROL-XL) 50 MG 24 hr tablet Take 1 tablet by mouth  once daily 90 tablet 0  . nitroGLYCERIN (NITROSTAT) 0.4 MG SL tablet Place under the tongue.    Marland Kitchen olopatadine (PATANOL) 0.1 % ophthalmic solution 1 drop as needed.    . ramipril (ALTACE) 10 MG capsule Take 1 capsule by mouth once daily 90 capsule 0  . rosuvastatin (CRESTOR) 40 MG tablet TAKE 1 TABLET BY MOUTH DAILY 90 tablet 1   No current facility-administered medications for this visit.    No Known Allergies  Social History   Socioeconomic History  . Marital status: Married    Spouse name: Not on file  . Number of children: 2  . Years of education: Not on file  . Highest education level: Not on file  Occupational History  . Occupation: Retired-Sold building supplies  Tobacco Use  . Smoking status: Never Smoker  . Smokeless tobacco: Never Used  Vaping Use  . Vaping Use: Never used  Substance and Sexual Activity  . Alcohol use: Never  . Drug use: Never  . Sexual activity: Not on file  Other Topics Concern  . Not on file  Social History Narrative  . Not on file   Social Determinants of Health   Financial Resource Strain:   . Difficulty of Paying Living Expenses: Not on file  Food  Insecurity:   . Worried About Programme researcher, broadcasting/film/video in the Last Year: Not on file  . Ran Out of Food in the Last Year: Not on file  Transportation Needs:   . Lack of Transportation (Medical): Not on file  . Lack of Transportation (Non-Medical): Not on file  Physical Activity:   . Days of Exercise per Week: Not on file  . Minutes of Exercise per Session: Not on file  Stress:   . Feeling of Stress : Not on file  Social Connections:   . Frequency of Communication with Friends and Family: Not on file  . Frequency of Social Gatherings with Friends and Family: Not on file  . Attends Religious Services: Not on file  . Active Member of Clubs or Organizations: Not on file  . Attends Banker Meetings: Not on file  . Marital Status: Not on file  Intimate Partner Violence:   . Fear of  Current or Ex-Partner: Not on file  . Emotionally Abused: Not on file  . Physically Abused: Not on file  . Sexually Abused: Not on file    Family History  Problem Relation Age of Onset  . Cancer Mother   . Heart attack Father 72  . Diabetes Brother     Review of Systems:  As stated in the HPI and otherwise negative.   BP 136/90   Pulse 66   Ht 6\' 1"  (1.854 m)   Wt 263 lb 12.8 oz (119.7 kg)   SpO2 95%   BMI 34.80 kg/m   Physical Examination: General: Well developed, well nourished, NAD  HEENT: OP clear, mucus membranes moist  SKIN: warm, dry. No rashes. Neuro: No focal deficits  Musculoskeletal: Muscle strength 5/5 all ext  Psychiatric: Mood and affect normal  Neck: No JVD, no carotid bruits, no thyromegaly, no lymphadenopathy.  Lungs:Clear bilaterally, no wheezes, rhonci, crackles Cardiovascular: Regular rate and rhythm. No murmurs, gallops or rubs. Abdomen:Soft. Bowel sounds present. Non-tender.  Extremities: No lower extremity edema. Pulses are 2 + in the bilateral DP/PT.  EKG:  EKG is ordered today. The ekg ordered today demonstrates sinus, Non-specific ST and T wave abn  Recent Labs: 04/14/2020: ALT 18; BUN 14; Creatinine, Ser 1.11; Hemoglobin 14.1; Platelets 177.0; Potassium 4.1; Sodium 140   Lipid Panel    Component Value Date/Time   CHOL 127 04/14/2020 1050   TRIG 140.0 04/14/2020 1050   HDL 35.50 (L) 04/14/2020 1050   CHOLHDL 4 04/14/2020 1050   VLDL 28.0 04/14/2020 1050   LDLCALC 63 04/14/2020 1050   LDLDIRECT 67.0 04/14/2020 1050     Wt Readings from Last 3 Encounters:  09/26/20 263 lb 12.8 oz (119.7 kg)  04/14/20 258 lb 6.4 oz (117.2 kg)  04/04/20 262 lb 3.2 oz (118.9 kg)     Other studies Reviewed: Additional studies/ records that were reviewed today include: Echo, office notes Review of the above records demonstrates: CAD   Assessment and Plan:   1. CAD s/p CABG without angina: He has no chest pain. Nuclear stress test May 2020 at [facility] (see report in Care Everywhere) without ischemia. LVEF normal by scan. Continue ASA, statin, Zetia, beta blocker, Imdur.   Current medicines are reviewed at length with the patient today.  The patient does not have concerns regarding medicines.  The following changes have been made:  no change  Labs/ tests ordered today include:   Orders Placed This Encounter  Procedures  . EKG 12-Lead  Disposition:   FU with me in one year.    Signed, Verne Carrow, MD 09/26/2020 10:13 AM    [facility] [address] [place], [place], [place]  [address] Phone: [phone]; Fax: [phone]

## 2020-09-26 NOTE — Patient Instructions (Signed)
Medication Instructions:  Your provider recommends that you continue on your current medications as directed. Please refer to the Current Medication list given to you today.   *If you need a refill on your cardiac medications before your next appointment, please call your pharmacy*  Follow-Up: At [facility], you and your health needs are our priority.  As part of our continuing mission to provide you with exceptional heart care, we have created designated Provider Care Teams.  These Care Teams include your primary Cardiologist (physician) and Advanced Practice Providers (APPs -  Physician Assistants and Nurse Practitioners) who all work together to provide you with the care you need, when you need it. Your next appointment:   12 month(s) The format for your next appointment:   In Person Provider:   You may see Dr. [name] or one of the following Advanced Practice Providers on your designated Care Team:    Ronie Spies, PA-C  Jacolyn Reedy, PA-C

## 2020-10-04 ENCOUNTER — Encounter: Payer: Self-pay | Admitting: Family Medicine

## 2020-10-10 ENCOUNTER — Ambulatory Visit (INDEPENDENT_AMBULATORY_CARE_PROVIDER_SITE_OTHER): Payer: PPO

## 2020-10-10 ENCOUNTER — Encounter: Payer: Self-pay | Admitting: Orthopaedic Surgery

## 2020-10-10 ENCOUNTER — Ambulatory Visit: Payer: PPO | Admitting: Orthopaedic Surgery

## 2020-10-10 DIAGNOSIS — M7662 Achilles tendinitis, left leg: Secondary | ICD-10-CM

## 2020-10-10 DIAGNOSIS — M79672 Pain in left foot: Secondary | ICD-10-CM

## 2020-10-10 MED ORDER — NABUMETONE 750 MG PO TABS: 750.0000 mg | ORAL_TABLET | Freq: Two times a day (BID) | ORAL | 1 refills | Status: DC | PRN

## 2020-10-10 NOTE — Progress Notes (Signed)
Office Visit Note   Patient: Shane Garrett           Date of Birth: [birth year]           MRN: [id number] Visit Date: 10/10/2020              Requested by: Mliss Sax, MD [address] [place],  [place] [address] PCP: Mliss Sax, MD   Assessment & Plan: Visit Diagnoses:  1. Pain of left heel   2. Achilles tendinitis, left leg     Plan: I do feel that that posterior osteophyte is contributing to some severe Achilles tendinitis.  I will have him stop wearing open back shoes for the next month.  I want him to try Voltaren gel twice daily over this area and I gave him a Thera-Band for stretching.  I instructed him how to do this and he demonstrated it back to me.  Follow-up will be as needed but if this does not calm down I would want to send him to outpatient physical therapy and he will let us know.  All questions and concerns were answered and addressed.  Follow-Up Instructions: Return if symptoms worsen or fail to improve.   Orders:  Orders Placed This Encounter  Procedures  . XR Os Calcis Left   Meds ordered this encounter  Medications  . nabumetone (RELAFEN) 750 MG tablet    Sig: Take 1 tablet (750 mg total) by mouth 2 (two) times daily as needed.    Dispense:  60 tablet    Refill:  1      Procedures: No procedures performed   Clinical Data: No additional findings.   Subjective: Chief Complaint  Patient presents with  . Left Foot - Pain    LEFT HEEL PAIN   The patient comes in with chief complaint of left heel pain that is causing the limp for the last 2 weeks.  He is an avid Teacher, English as a foreign language.  He is someone that does usually wear a lot of flip-flops and crocs.  He says is worse with pressure.  He is a diabetic and is on Metformin.  He denies any injury.  He is physically walking with a limp.  He has no known injury.  He does play a lot of golf.  And again he wears a lot of open back shoes.  HPI  Review of Systems He currently denies any  headache, chest pain, shortness of breath, fever, chills, nausea, vomiting  Objective: Vital Signs: There were no vitals taken for this visit.  Physical Exam He is alert and orient x3 and in no acute distress.  He is walking with a limp. Ortho Exam  Examination of his left heel shows no pain on the plantar aspect but he has severe pain that is pinpoint over the posterior calcaneus at the Achilles.  His Thompson test is negative.  His foot is well-perfused. Specialty Comments:  No specialty comments available.  Imaging: XR Os Calcis Left  Result Date: 10/10/2020 2 views of the calcaneus show no fracture.  There is a osteophyte off of the posterior aspect of the calcaneus there is involving the Achilles tendon area.    PMFS History: Patient Active Problem List   Diagnosis Date Noted  . Inclusion cyst 04/04/2020  . Healthcare maintenance 04/04/2020  . Essential hypertension 04/04/2020  . Unilateral primary osteoarthritis, left knee 08/05/2019  . Hx of CABG 04/15/2019  . Anxiety 12/01/2018  . Coronary artery disease of native artery  of native heart with stable angina pectoris (HCC) 12/01/2018  . Gout 12/01/2018  . Hyperlipidemia 12/01/2018  . Hypogonadism in male 12/01/2018  . Type 2 diabetes mellitus without complication, without long-term current use of insulin (HCC) 12/01/2018   Past Medical History:  Diagnosis Date  . Anxiety   . CAD (coronary artery disease)    2V CABG in 2012  . Diabetes mellitus without complication (HCC)   . Hyperlipidemia   . Hypertension     Family History  Problem Relation Age of Onset  . Cancer Mother   . Heart attack Father 74  . Diabetes Brother     Past Surgical History:  Procedure Laterality Date  . CORONARY ARTERY BYPASS GRAFT     2V CABG in [place]   Social History   Occupational History  . Occupation: Retired-Sold building supplies  Tobacco Use  . Smoking status: Never Smoker  . Smokeless tobacco: Never Used  Vaping Use  .  Vaping Use: Never used  Substance and Sexual Activity  . Alcohol use: Never  . Drug use: Never  . Sexual activity: Not on file

## 2020-10-14 ENCOUNTER — Other Ambulatory Visit: Payer: Self-pay | Admitting: Orthopaedic Surgery

## 2020-10-14 ENCOUNTER — Telehealth: Payer: Self-pay | Admitting: Orthopaedic Surgery

## 2020-10-14 MED ORDER — METHYLPREDNISOLONE 4 MG PO TABS: ORAL_TABLET | ORAL | 0 refills | Status: DC

## 2020-10-14 NOTE — Telephone Encounter (Signed)
I sent in a steroid taper.

## 2020-10-14 NOTE — Telephone Encounter (Signed)
Pt called stating he was diagnosed with achilles heel and because he is a diabetic Dr.Blackman didn't prescribe steroids but the pt states he is border line diabetic and like to have steroids called in since it's taking too long to clear up on its own  [phone]

## 2020-10-14 NOTE — Telephone Encounter (Signed)
Please see below and advise.

## 2020-10-14 NOTE — Telephone Encounter (Signed)
I called pt and advised rx has been sent in to pharm.

## 2020-11-10 ENCOUNTER — Other Ambulatory Visit: Payer: Self-pay | Admitting: Family Medicine

## 2020-11-10 ENCOUNTER — Other Ambulatory Visit: Payer: Self-pay

## 2020-11-10 ENCOUNTER — Encounter: Payer: Self-pay | Admitting: Family

## 2020-11-10 ENCOUNTER — Ambulatory Visit (INDEPENDENT_AMBULATORY_CARE_PROVIDER_SITE_OTHER): Payer: PPO | Admitting: Family

## 2020-11-10 VITALS — BP 132/94 | HR 64 | Temp 98.2°F | Ht 73.0 in | Wt 265.2 lb

## 2020-11-10 DIAGNOSIS — J019 Acute sinusitis, unspecified: Secondary | ICD-10-CM | POA: Diagnosis not present

## 2020-11-10 DIAGNOSIS — B9689 Other specified bacterial agents as the cause of diseases classified elsewhere: Secondary | ICD-10-CM

## 2020-11-10 MED ORDER — FLUTICASONE PROPIONATE 50 MCG/ACT NA SUSP: 2.0000 | Freq: Every day | NASAL | 6 refills | Status: DC

## 2020-11-10 MED ORDER — AMOXICILLIN 500 MG PO CAPS: 500.0000 mg | ORAL_CAPSULE | Freq: Two times a day (BID) | ORAL | 0 refills | Status: DC

## 2020-11-10 NOTE — Progress Notes (Signed)
Acute Office Visit  Subjective:    Patient ID: Shane Garrett, male    DOB: 06/24/1956, 64 y.o.   MRN: [id number]  Chief Complaint  Patient presents with   Sinusitis    Sinus pressure in head, complains that teeth on the right side of face hurt, ears popping symptoms x few weeks.     HPI Patient is in today with c/o sinus pressure/pain, congestion, pain in his teeth x 4 weeks but worse now. He has not taken any medication.   Past Medical History:  Diagnosis Date   Anxiety    CAD (coronary artery disease)    2V CABG in 2012   Diabetes mellitus without complication (HCC)    Hyperlipidemia    Hypertension     Past Surgical History:  Procedure Laterality Date   CORONARY ARTERY BYPASS GRAFT     2V CABG in [place]    Family History  Problem Relation Age of Onset   Cancer Mother    Heart attack Father 55   Diabetes Brother     Social History   Socioeconomic History   Marital status: Married    Spouse name: Not on file   Number of children: 2   Years of education: Not on file   Highest education level: Not on file  Occupational History   Occupation: Retired-Sold building supplies  Tobacco Use   Smoking status: Never Smoker   Smokeless tobacco: Never Used  Building services engineer Use: Never used  Substance and Sexual Activity   Alcohol use: Never   Drug use: Never   Sexual activity: Not on file  Other Topics Concern   Not on file  Social History Narrative   Not on file   Social Determinants of Health   Financial Resource Strain: Not on file  Food Insecurity: Not on file  Transportation Needs: Not on file  Physical Activity: Not on file  Stress: Not on file  Social Connections: Not on file  Intimate Partner Violence: Not on file    Outpatient Medications Prior to Visit  Medication Sig Dispense Refill   aspirin EC 81 MG tablet Take by mouth.     citalopram (CELEXA) 40 MG tablet Take 1 tablet by mouth once daily 90 tablet 0    clonazePAM (KLONOPIN) 0.5 MG tablet Take 1 tablet (0.5 mg total) by mouth daily as needed for anxiety. 30 tablet 0   colchicine 0.6 MG tablet Take by mouth as needed.      ezetimibe (ZETIA) 10 MG tablet Take 1 tablet (10 mg total) by mouth daily. 90 tablet 3   isosorbide mononitrate (IMDUR) 30 MG 24 hr tablet Take 15 mg by mouth daily.     metFORMIN (GLUCOPHAGE-XR) 500 MG 24 hr tablet Take 1 tablet by mouth once daily with breakfast 90 tablet 0   nitroGLYCERIN (NITROSTAT) 0.4 MG SL tablet Place under the tongue.     olopatadine (PATANOL) 0.1 % ophthalmic solution 1 drop as needed.     ramipril (ALTACE) 10 MG capsule Take 1 capsule by mouth once daily 90 capsule 0   rosuvastatin (CRESTOR) 40 MG tablet TAKE 1 TABLET BY MOUTH DAILY 90 tablet 1   methylPREDNISolone (MEDROL) 4 MG tablet Medrol dose pack. Take as instructed (Patient not taking: Reported on 11/10/2020) 21 tablet 0   metoprolol succinate (TOPROL-XL) 50 MG 24 hr tablet Take 1 tablet by mouth once daily 90 tablet 0   nabumetone (RELAFEN) 750 MG tablet Take 1 tablet (750  mg total) by mouth 2 (two) times daily as needed. 60 tablet 1   No facility-administered medications prior to visit.    No Known Allergies  Review of Systems  Constitutional: Negative.   HENT: Positive for congestion, sinus pressure and sinus pain.   Respiratory: Positive for cough. Negative for shortness of breath and wheezing.   Cardiovascular: Negative.   Endocrine: Negative.   Musculoskeletal: Negative.   Skin: Negative.   Allergic/Immunologic: Negative.   Neurological: Negative.   Hematological: Negative.   Psychiatric/Behavioral: Negative.   All other systems reviewed and are negative.      Objective:    Physical Exam Constitutional:      Appearance: Normal appearance. He is obese.  HENT:     Head: Normocephalic.     Right Ear: Tympanic membrane and ear canal normal.     Left Ear: Tympanic membrane and ear canal normal.     Nose:  Congestion and rhinorrhea present.     Comments: Maxillary sinus tenderness    Mouth/Throat:     Mouth: Mucous membranes are moist.  Cardiovascular:     Rate and Rhythm: Normal rate and regular rhythm.  Pulmonary:     Effort: Pulmonary effort is normal.     Breath sounds: Normal breath sounds.  Abdominal:     Palpations: Abdomen is soft.  Musculoskeletal:        General: Normal range of motion.     Cervical back: Normal range of motion and neck supple.  Skin:    General: Skin is warm and dry.  Neurological:     Mental Status: He is alert and oriented to person, place, and time.  Psychiatric:        Mood and Affect: Mood normal.        Behavior: Behavior normal.     BP (!) 132/94    Pulse 64    Temp 98.2 F (36.8 C) (Temporal)    Ht 6\' 1"  (1.854 m)    Wt 265 lb 3.2 oz (120.3 kg)    SpO2 94%    BMI 34.99 kg/m  Wt Readings from Last 3 Encounters:  11/10/20 265 lb 3.2 oz (120.3 kg)  09/26/20 263 lb 12.8 oz (119.7 kg)  04/14/20 258 lb 6.4 oz (117.2 kg)    Health Maintenance Due  Topic Date Due   TETANUS/TDAP  Never done   HEMOGLOBIN A1C  04/10/2020   FOOT EXAM  04/13/2020   PNA vac Low Risk Adult (2 of 2 - PPSV23) 04/13/2020    There are no preventive care reminders to display for this patient.   No results found for: TSH Lab Results  Component Value Date   WBC 7.8 04/14/2020   HGB 14.1 04/14/2020   HCT 41.8 04/14/2020   MCV 90.4 04/14/2020   PLT 177.0 04/14/2020   Lab Results  Component Value Date   NA 140 04/14/2020   K 4.1 04/14/2020   CO2 25 04/14/2020   GLUCOSE 116 (H) 04/14/2020   BUN 14 04/14/2020   CREATININE 1.11 04/14/2020   BILITOT 0.7 04/14/2020   ALKPHOS 58 04/14/2020   AST 15 04/14/2020   ALT 18 04/14/2020   PROT 6.7 04/14/2020   ALBUMIN 4.2 04/14/2020   CALCIUM 9.6 04/14/2020   GFR 66.24 04/14/2020   Lab Results  Component Value Date   CHOL 127 04/14/2020   Lab Results  Component Value Date   HDL 35.50 (L) 04/14/2020   Lab  Results  Component Value Date  LDLCALC 63 04/14/2020   Lab Results  Component Value Date   TRIG 140.0 04/14/2020   Lab Results  Component Value Date   CHOLHDL 4 04/14/2020   Lab Results  Component Value Date   HGBA1C 7.0 (H) 10/12/2019       Assessment & Plan:   Problem List Items Addressed This Visit   None   Visit Diagnoses    Acute bacterial sinusitis    -  Primary   Relevant Medications   amoxicillin (AMOXIL) 500 MG capsule   fluticasone (FLONASE) 50 MCG/ACT nasal spray       Meds ordered this encounter  Medications   amoxicillin (AMOXIL) 500 MG capsule    Sig: Take 1 capsule (500 mg total) by mouth 2 (two) times daily.    Dispense:  14 capsule    Refill:  0   fluticasone (FLONASE) 50 MCG/ACT nasal spray    Sig: Place 2 sprays into both nostrils daily.    Dispense:  16 g    Refill:  6    Call the office if symptoms worsen or persist. Recheck as needed Eulis Foster, FNP

## 2020-11-10 NOTE — Telephone Encounter (Signed)
Forwarding to PCP for review.  

## 2020-11-10 NOTE — Patient Instructions (Signed)

## 2020-11-22 ENCOUNTER — Telehealth: Payer: Self-pay | Admitting: Family Medicine

## 2020-11-22 ENCOUNTER — Other Ambulatory Visit: Payer: Self-pay | Admitting: Family Medicine

## 2020-11-22 DIAGNOSIS — I10 Essential (primary) hypertension: Secondary | ICD-10-CM

## 2020-11-22 NOTE — Telephone Encounter (Signed)
Yes, I will see him.

## 2020-11-22 NOTE — Telephone Encounter (Signed)
Patient is calling to see if he can get a prescription for Allopurinol. He states that he and Dr. Doreene Burke discussed him going back on this medication. If approved, please send to [place] on [facility]. and call him at [phone] to let him know if/when it has been sent in.

## 2020-11-22 NOTE — Telephone Encounter (Signed)
  Patient requesting [facility] appointment with Dr Yetta Barre. Patient states "things are not working out", requesting transfer appointment

## 2020-11-23 NOTE — Telephone Encounter (Signed)
Spoke with patient who states that he feels like his gout is about to flare up patient wanting to know if Allopurinol prescription could be sent in for this? Please advise last OV 11/10/20 but patient states that he was not having this issue at that time.

## 2020-11-24 NOTE — Telephone Encounter (Signed)
Left message on voicemail to call office.  

## 2020-12-09 ENCOUNTER — Other Ambulatory Visit: Payer: Self-pay | Admitting: Orthopaedic Surgery

## 2020-12-09 ENCOUNTER — Telehealth: Payer: Self-pay

## 2020-12-09 MED ORDER — NABUMETONE 750 MG PO TABS: 750.0000 mg | ORAL_TABLET | Freq: Two times a day (BID) | ORAL | 1 refills | Status: DC | PRN

## 2020-12-09 NOTE — Telephone Encounter (Signed)
Patient called he is requesting rx for pain for his achilles tendon that was treated in the past. CB:[phone]

## 2020-12-09 NOTE — Telephone Encounter (Signed)
I sent in some Relafen to his pharmacy.  He should also get over-the-counter Voltaren Gel to put on the area 2 to 4 times daily as needed.

## 2020-12-09 NOTE — Telephone Encounter (Signed)
Patient aware.

## 2020-12-21 ENCOUNTER — Encounter: Payer: Self-pay | Admitting: Internal Medicine

## 2020-12-21 ENCOUNTER — Other Ambulatory Visit: Payer: Self-pay

## 2020-12-21 ENCOUNTER — Ambulatory Visit (INDEPENDENT_AMBULATORY_CARE_PROVIDER_SITE_OTHER): Payer: PPO | Admitting: Internal Medicine

## 2020-12-21 VITALS — BP 142/96 | HR 65 | Temp 98.2°F | Resp 16 | Ht 73.0 in | Wt 267.0 lb

## 2020-12-21 DIAGNOSIS — F325 Major depressive disorder, single episode, in full remission: Secondary | ICD-10-CM | POA: Diagnosis not present

## 2020-12-21 DIAGNOSIS — E118 Type 2 diabetes mellitus with unspecified complications: Secondary | ICD-10-CM | POA: Diagnosis not present

## 2020-12-21 DIAGNOSIS — I10 Essential (primary) hypertension: Secondary | ICD-10-CM | POA: Diagnosis not present

## 2020-12-21 DIAGNOSIS — I25118 Atherosclerotic heart disease of native coronary artery with other forms of angina pectoris: Secondary | ICD-10-CM | POA: Diagnosis not present

## 2020-12-21 DIAGNOSIS — E785 Hyperlipidemia, unspecified: Secondary | ICD-10-CM

## 2020-12-21 DIAGNOSIS — R0683 Snoring: Secondary | ICD-10-CM

## 2020-12-21 DIAGNOSIS — M1A0790 Idiopathic chronic gout, unspecified ankle and foot, without tophus (tophi): Secondary | ICD-10-CM | POA: Diagnosis not present

## 2020-12-21 LAB — POCT GLYCOSYLATED HEMOGLOBIN (HGB A1C): Hemoglobin A1C: 7.2 % — AB (ref 4.0–5.6)

## 2020-12-21 LAB — URIC ACID: Uric Acid, Serum: 8.5 mg/dL — ABNORMAL HIGH (ref 4.0–7.8)

## 2020-12-21 LAB — BASIC METABOLIC PANEL
BUN: 20 mg/dL (ref 6–23)
CO2: 26 mEq/L (ref 19–32)
Calcium: 10 mg/dL (ref 8.4–10.5)
Chloride: 105 mEq/L (ref 96–112)
Creatinine, Ser: 1.07 mg/dL (ref 0.40–1.50)
GFR: 72.14 mL/min (ref >60.00)
Glucose, Bld: 172 mg/dL — ABNORMAL HIGH (ref 70–99)
Potassium: 4 mEq/L (ref 3.5–5.1)
Sodium: 138 mEq/L (ref 135–145)

## 2020-12-21 MED ORDER — CITALOPRAM HYDROBROMIDE 40 MG PO TABS: 40.0000 mg | ORAL_TABLET | Freq: Every day | ORAL | 1 refills | Status: DC

## 2020-12-21 MED ORDER — ROSUVASTATIN CALCIUM 40 MG PO TABS: 40.0000 mg | ORAL_TABLET | Freq: Every day | ORAL | 1 refills | Status: DC

## 2020-12-21 MED ORDER — SYNJARDY 12.5-500 MG PO TABS: 1.0000 | ORAL_TABLET | Freq: Two times a day (BID) | ORAL | 1 refills | Status: DC

## 2020-12-21 MED ORDER — ALLOPURINOL 100 MG PO TABS: 100.0000 mg | ORAL_TABLET | Freq: Every day | ORAL | 1 refills | Status: DC

## 2020-12-21 NOTE — Patient Instructions (Signed)
Type 2 Diabetes Mellitus, Diagnosis, Adult Type 2 diabetes (type 2 diabetes mellitus) is a long-term, or chronic, disease. In type 2 diabetes, one or both of these problems may be present:  The pancreas does not make enough of a hormone called insulin.  Cells in the body do not respond properly to insulin that the body makes (insulin resistance). Normally, insulin allows blood sugar (glucose) to enter cells in the body. The cells use glucose for energy. Insulin resistance or lack of insulin causes excess glucose to build up in the blood instead of going into cells. This causes high blood glucose (hyperglycemia).  What are the causes? The exact cause of type 2 diabetes is not known. What increases the risk? The following factors may make you more likely to develop this condition:  Having a family member with type 2 diabetes.  Being overweight or obese.  Being inactive (sedentary).  Having been diagnosed with insulin resistance.  Having a history of prediabetes, diabetes when you were pregnant (gestational diabetes), or polycystic ovary syndrome (PCOS). What are the signs or symptoms? In the early stage of this condition, you may not have symptoms. Symptoms develop slowly and may include:  Increased thirst or hunger.  Increased urination.  Unexplained weight loss.  Tiredness (fatigue) or weakness.  Vision changes, such as blurry vision.  Dark patches on the skin. How is this diagnosed? This condition is diagnosed based on your symptoms, your medical history, a physical exam, and your blood glucose level. Your blood glucose may be checked with one or more of the following blood tests:  A fasting blood glucose (FBG) test. You will not be allowed to eat (you will fast) for 8 hours or longer before a blood sample is taken.  A random blood glucose test. This test checks blood glucose at any time of day regardless of when you ate.  An A1C (hemoglobin A1C) blood test. This test  provides information about blood glucose levels over the previous 2-3 months.  An oral glucose tolerance test (OGTT). This test measures your blood glucose at two times: ? After fasting. This is your baseline blood glucose level. ? Two hours after drinking a beverage that contains glucose. You may be diagnosed with type 2 diabetes if:  Your fasting blood glucose level is 126 mg/dL (7.0 mmol/L) or higher.  Your random blood glucose level is 200 mg/dL (11.1 mmol/L) or higher.  Your A1C level is 6.5% or higher.  Your oral glucose tolerance test result is higher than 200 mg/dL (11.1 mmol/L). These blood tests may be repeated to confirm your diagnosis.   How is this treated? Your treatment may be managed by a specialist called an endocrinologist. Type 2 diabetes may be treated by following instructions from your health care provider about:  Making dietary and lifestyle changes. These may include: ? Following a personalized nutrition plan that is developed by a registered dietitian. ? Exercising regularly. ? Finding ways to manage stress.  Checking your blood glucose level as often as told.  Taking diabetes medicines or insulin daily. This helps to keep your blood glucose levels in the healthy range.  Taking medicines to help prevent complications from diabetes. Medicines may include: ? Aspirin. ? Medicine to lower cholesterol. ? Medicine to control blood pressure. Your health care provider will set treatment goals for you. Your goals will be based on your age, other medical conditions you have, and how you respond to diabetes treatment. Generally, the goal of treatment is to maintain the   following blood glucose levels:  Before meals: 80-130 mg/dL (4.4-7.2 mmol/L).  After meals: below 180 mg/dL (10 mmol/L).  A1C level: less than 7%. Follow these instructions at home: Questions to ask your health care provider Consider asking the following questions:  Should I meet with a certified  diabetes care and education specialist?  What diabetes medicines do I need, and when should I take them?  What equipment will I need to manage my diabetes at home?  How often do I need to check my blood glucose?  Where can I find a support group for people with diabetes?  What number can I call if I have questions?  When is my next appointment? General instructions  Take over-the-counter and prescription medicines only as told by your health care provider.  Keep all follow-up visits as told by your health care provider. This is important. Where to find more information  American Diabetes Association (ADA): www.diabetes.org  American Association of Diabetes Care and Education Specialists (ADCES): www.diabeteseducator.org  International Diabetes Federation (IDF): www.idf.org Contact a health care provider if:  Your blood glucose is at or above 240 mg/dL (13.3 mmol/L) for 2 days in a row.  You have been sick or have had a fever for 2 days or longer, and you are not getting better.  You have any of the following problems for more than 6 hours: ? You cannot eat or drink. ? You have nausea and vomiting. ? You have diarrhea. Get help right away if:  You have severe hypoglycemia. This means your blood glucose is lower than 54 mg/dL (3.0 mmol/L).  You become confused or you have trouble thinking clearly.  You have difficulty breathing.  You have moderate or large ketone levels in your urine. These symptoms may represent a serious problem that is an emergency. Do not wait to see if the symptoms will go away. Get medical help right away. Call your local emergency services (911 in the [place] Do not drive yourself to the hospital. Summary  Type 2 diabetes (type 2 diabetes mellitus) is a long-term, or chronic, disease. In type 2 diabetes, the pancreas does not make enough of a hormone called insulin, or cells in the body do not respond properly to insulin that the body makes (insulin  resistance).  This condition is treated by making dietary and lifestyle changes and taking diabetes medicines or insulin.  Your health care provider will set treatment goals for you. Your goals will be based on your age, other medical conditions you have, and how you respond to diabetes treatment.  Keep all follow-up visits as told by your health care provider. This is important. This information is not intended to replace advice given to you by your health care provider. Make sure you discuss any questions you have with your health care provider. Document Revised: 06/08/2020 Document Reviewed: 06/08/2020 Elsevier Patient Education  2021 Elsevier Inc.  

## 2020-12-21 NOTE — Progress Notes (Signed)
Subjective:  Patient ID: Shane Garrett, male    DOB: [birth year]  Age: 65 y.o. MRN: [id number]  CC: Hypertension and Diabetes  This visit occurred during the SARS-CoV-2 public health emergency.  Safety protocols were in place, including screening questions prior to the visit, additional usage of staff PPE, and extensive cleaning of exam room while observing appropriate contact time as indicated for disinfecting solutions.   NEW TO ME  HPI Shane Garrett presents for f/up - He has a remote history of pain and swelling in his joints and is being treated for gout with colchicine.  He also has a history of DM 2 and hypertension.  He is active and denies any recent episodes of chest pain, shortness of breath, palpitations, edema, or fatigue.  He is not willing to get vaccinated against pneumonia, influenza, or tetanus today.  Outpatient Medications Prior to Visit  Medication Sig Dispense Refill  . amoxicillin (AMOXIL) 500 MG capsule Take 1 capsule (500 mg total) by mouth 2 (two) times daily. 14 capsule 0  . aspirin EC 81 MG tablet Take by mouth.    . clonazePAM (KLONOPIN) 0.5 MG tablet TAKE 1 TABLET BY MOUTH ONCE DAILY AS NEEDED FOR ANXIETY 30 tablet 0  . colchicine 0.6 MG tablet Take by mouth as needed.     . ezetimibe (ZETIA) 10 MG tablet Take 1 tablet (10 mg total) by mouth daily. 90 tablet 3  . fluticasone (FLONASE) 50 MCG/ACT nasal spray Place 2 sprays into both nostrils daily. 16 g 6  . isosorbide mononitrate (IMDUR) 30 MG 24 hr tablet Take 15 mg by mouth daily.    . metoprolol succinate (TOPROL-XL) 50 MG 24 hr tablet Take 1 tablet by mouth once daily 90 tablet 0  . nabumetone (RELAFEN) 750 MG tablet Take 1 tablet (750 mg total) by mouth 2 (two) times daily as needed. 60 tablet 1  . nitroGLYCERIN (NITROSTAT) 0.4 MG SL tablet Place under the tongue.    Marland Kitchen olopatadine (PATANOL) 0.1 % ophthalmic solution 1 drop as needed.    . ramipril (ALTACE) 10 MG capsule Take 1 capsule by mouth once daily  90 capsule 0  . citalopram (CELEXA) 40 MG tablet Take 1 tablet by mouth once daily 90 tablet 0  . metFORMIN (GLUCOPHAGE-XR) 500 MG 24 hr tablet Take 1 tablet by mouth once daily with breakfast 90 tablet 0  . methylPREDNISolone (MEDROL) 4 MG tablet Medrol dose pack. Take as instructed 21 tablet 0  . rosuvastatin (CRESTOR) 40 MG tablet TAKE 1 TABLET BY MOUTH DAILY 90 tablet 1   No facility-administered medications prior to visit.    ROS Review of Systems  Constitutional: Positive for unexpected weight change (wt gain). Negative for appetite change, chills, diaphoresis and fatigue.  HENT: Negative.   Respiratory: Negative for cough, chest tightness, shortness of breath and wheezing.   Cardiovascular: Negative for chest pain, palpitations and leg swelling.  Gastrointestinal: Negative for abdominal pain, constipation, diarrhea, nausea and vomiting.  Endocrine: Negative for polydipsia, polyphagia and polyuria.  Genitourinary: Negative.  Negative for difficulty urinating and frequency.  Musculoskeletal: Positive for arthralgias. Negative for myalgias.  Skin: Negative.  Negative for color change.  Neurological: Negative.  Negative for dizziness and weakness.  Hematological: Negative for adenopathy. Does not bruise/bleed easily.  Psychiatric/Behavioral: Negative.     Objective:  BP (!) 142/96   Pulse 65   Temp 98.2 F (36.8 C) (Oral)   Resp 16   Ht 6\' 1"  (1.854 m)  Wt 267 lb (121.1 kg)   SpO2 95%   BMI 35.23 kg/m   BP Readings from Last 3 Encounters:  12/21/20 (!) 142/96  11/10/20 (!) 132/94  09/26/20 136/90    Wt Readings from Last 3 Encounters:  12/21/20 267 lb (121.1 kg)  11/10/20 265 lb 3.2 oz (120.3 kg)  09/26/20 263 lb 12.8 oz (119.7 kg)    Physical Exam Vitals reviewed.  Constitutional:      Appearance: Normal appearance.  HENT:     Nose: Nose normal.     Mouth/Throat:     Mouth: Mucous membranes are moist.  Eyes:     General: No scleral icterus.     Conjunctiva/sclera: Conjunctivae normal.  Cardiovascular:     Rate and Rhythm: Normal rate and regular rhythm.     Heart sounds: No murmur heard.   Pulmonary:     Effort: Pulmonary effort is normal.     Breath sounds: No stridor. No wheezing, rhonchi or rales.  Abdominal:     General: Abdomen is protuberant. Bowel sounds are normal. There is no distension.     Palpations: Abdomen is soft. There is no hepatomegaly, splenomegaly or mass.     Tenderness: There is no abdominal tenderness.  Musculoskeletal:        General: No swelling or tenderness. Normal range of motion.     Cervical back: Neck supple.     Right lower leg: No edema.     Left lower leg: No edema.  Lymphadenopathy:     Cervical: No cervical adenopathy.  Skin:    General: Skin is warm and dry.     Coloration: Skin is not pale.  Neurological:     General: No focal deficit present.     Mental Status: He is alert and oriented to person, place, and time. Mental status is at baseline.  Psychiatric:        Mood and Affect: Mood normal.     Lab Results  Component Value Date   WBC 7.8 04/14/2020   HGB 14.1 04/14/2020   HCT 41.8 04/14/2020   PLT 177.0 04/14/2020   GLUCOSE 172 (H) 12/21/2020   CHOL 127 04/14/2020   TRIG 140.0 04/14/2020   HDL 35.50 (L) 04/14/2020   LDLDIRECT 67.0 04/14/2020   LDLCALC 63 04/14/2020   ALT 18 04/14/2020   AST 15 04/14/2020   NA 138 12/21/2020   K 4.0 12/21/2020   CL 105 12/21/2020   CREATININE 1.07 12/21/2020   BUN 20 12/21/2020   CO2 26 12/21/2020   PSA 2.73 04/14/2020   HGBA1C 7.2 (A) 12/21/2020   MICROALBUR <0.7 04/14/2020    No results found.  Assessment & Plan:   Shane Garrett was seen today for hypertension and diabetes.  Diagnoses and all orders for this visit:  Major depressive disorder with single episode, in full remission (HCC) -     citalopram (CELEXA) 40 MG tablet; Take 1 tablet (40 mg total) by mouth daily.  Idiopathic chronic gout of foot without tophus,  unspecified laterality-uric acid level is too high so I recommended that he start taking a xanthine oxidase inhibitor. -     Uric acid; Future -     Uric acid -     allopurinol (ZYLOPRIM) 100 MG tablet; Take 1 tablet (100 mg total) by mouth daily.  Primary hypertension-his blood pressure is not adequately well controlled.  He agrees to improve his lifestyle modifications. -     Basic metabolic panel; Future -  Basic metabolic panel  Type II diabetes mellitus with manifestations (HCC)- His A1c is at 7.2%.  I recommended that he treat this with Metformin and an SGLT2 inhibitor. -     Empagliflozin-metFORMIN HCl (SYNJARDY) 12.5-500 MG TABS; Take 1 tablet by mouth 2 (two) times daily. -     POCT glycosylated hemoglobin (Hb A1C) -     HM Diabetes Foot Exam  Loud snoring -     Ambulatory referral to Sleep Studies  Coronary artery disease of native artery of native heart with stable angina pectoris (HCC) -     rosuvastatin (CRESTOR) 40 MG tablet; Take 1 tablet (40 mg total) by mouth daily.  Dyslipidemia, goal LDL below 70 -     rosuvastatin (CRESTOR) 40 MG tablet; Take 1 tablet (40 mg total) by mouth daily.   I have discontinued [name] "Steve"'s metFORMIN and methylPREDNISolone. I have also changed his citalopram and rosuvastatin. Additionally, I am having him start on Synjardy and allopurinol. Lastly, I am having him maintain his aspirin EC, nitroGLYCERIN, olopatadine, colchicine, ezetimibe, isosorbide mononitrate, clonazePAM, amoxicillin, fluticasone, metoprolol succinate, ramipril, and nabumetone.  Meds ordered this encounter  Medications  . citalopram (CELEXA) 40 MG tablet    Sig: Take 1 tablet (40 mg total) by mouth daily.    Dispense:  90 tablet    Refill:  1  . Empagliflozin-metFORMIN HCl (SYNJARDY) 12.5-500 MG TABS    Sig: Take 1 tablet by mouth 2 (two) times daily.    Dispense:  180 tablet    Refill:  1  . allopurinol (ZYLOPRIM) 100 MG tablet    Sig: Take 1 tablet  (100 mg total) by mouth daily.    Dispense:  90 tablet    Refill:  1  . rosuvastatin (CRESTOR) 40 MG tablet    Sig: Take 1 tablet (40 mg total) by mouth daily.    Dispense:  90 tablet    Refill:  1     Follow-up: Return in about 4 months (around 04/20/2021).  Sanda Linger, MD

## 2020-12-29 ENCOUNTER — Encounter: Payer: Self-pay | Admitting: Internal Medicine

## 2021-01-26 ENCOUNTER — Encounter: Payer: Self-pay | Admitting: Neurology

## 2021-01-26 ENCOUNTER — Other Ambulatory Visit: Payer: Self-pay

## 2021-01-26 ENCOUNTER — Ambulatory Visit: Payer: PPO | Admitting: Neurology

## 2021-01-26 VITALS — BP 124/78 | HR 60 | Ht 73.0 in | Wt 262.0 lb

## 2021-01-26 DIAGNOSIS — R0683 Snoring: Secondary | ICD-10-CM | POA: Diagnosis not present

## 2021-01-26 DIAGNOSIS — R351 Nocturia: Secondary | ICD-10-CM | POA: Diagnosis not present

## 2021-01-26 DIAGNOSIS — Z9189 Other specified personal risk factors, not elsewhere classified: Secondary | ICD-10-CM

## 2021-01-26 DIAGNOSIS — Z951 Presence of aortocoronary bypass graft: Secondary | ICD-10-CM

## 2021-01-26 DIAGNOSIS — E669 Obesity, unspecified: Secondary | ICD-10-CM

## 2021-01-26 NOTE — Patient Instructions (Signed)

## 2021-01-26 NOTE — Progress Notes (Signed)
Subjective:    Patient ID: Shane Garrett is a 65 y.o. male.  HPI     Shane Foley, MD, PhD [facility] [address] [address] [place], [place] [address]  Dear Dr. Yetta Barre,   I saw your patient, Shane Garrett, upon your kind request, in my Sleep clinic today for initial consultation of his sleep disorder, in particular, concern for underlying obstructive sleep apnea.  The patient is unaccompanied today.  As you know, Shane Garrett is a 65 year old right-handed gentleman with an underlying medical history of hypertension, hyperlipidemia, coronary artery disease, s/p CABG in 2012 at age 22, diabetes, gout, depression, anxiety, and obesity, who reports snoring and excessive daytime somnolence. I reviewed your office note from 12/21/2020.  His Epworth sleepiness score is 6 out of 24, but he does take a nap on a fairly regular basis in the afternoon, about an hour long or less than 1 hour.  His fatigue score is 30 out of 63.  He is married and lives with his wife, he moved from [place] some 3 years ago.  He has grown children.  He has no pets at the house.  He is retired, he Corporate treasurer supplies.  He quit smoking in 1978 and drinks caffeine in the form of coffee, 2 cups in the morning and tea with lunch.  He drinks alcohol occasionally, maybe once a week.  He denies recurrent morning headaches.  He has nocturia about twice per average night.  He does not typically watch TV in his bedroom.  He goes to bed between 9 and 10 and rise time is generally between 630 and 7:30 AM.  He had a tonsillectomy as a teenager.   His Past Medical History Is Significant For: Past Medical History:  Diagnosis Date  . Anxiety   . CAD (coronary artery disease)    2V CABG in 2012  . Diabetes mellitus without complication (HCC)   . Hyperlipidemia   . Hypertension     His Past Surgical History Is Significant For: Past Surgical History:  Procedure Laterality Date  . CORONARY ARTERY  BYPASS GRAFT     2V CABG in [place]    His Family History Is Significant For: Family History  Problem Relation Age of Onset  . Cancer Mother   . Heart attack Father 70  . Diabetes Brother     His Social History Is Significant For: Social History   Socioeconomic History  . Marital status: Married    Spouse name: Not on file  . Number of children: 2  . Years of education: Not on file  . Highest education level: Not on file  Occupational History  . Occupation: Retired-Sold building supplies  Tobacco Use  . Smoking status: Never Smoker  . Smokeless tobacco: Never Used  Vaping Use  . Vaping Use: Never used  Substance and Sexual Activity  . Alcohol use: Never  . Drug use: Never  . Sexual activity: Not on file  Other Topics Concern  . Not on file  Social History Narrative  . Not on file   Social Determinants of Health   Financial Resource Strain: Not on file  Food Insecurity: Not on file  Transportation Needs: Not on file  Physical Activity: Not on file  Stress: Not on file  Social Connections: Not on file    His Allergies Are:  No Known Allergies:   His Current Medications Are:  Outpatient Encounter Medications as of 01/26/2021  Medication Sig  .  allopurinol (ZYLOPRIM) 100 MG tablet Take 1 tablet (100 mg total) by mouth daily.  Marland Kitchen amoxicillin (AMOXIL) 500 MG capsule Take 1 capsule (500 mg total) by mouth 2 (two) times daily.  Marland Kitchen aspirin EC 81 MG tablet Take by mouth.  . citalopram (CELEXA) 40 MG tablet Take 1 tablet (40 mg total) by mouth daily.  . clonazePAM (KLONOPIN) 0.5 MG tablet TAKE 1 TABLET BY MOUTH ONCE DAILY AS NEEDED FOR ANXIETY  . colchicine 0.6 MG tablet Take by mouth as needed.   . Empagliflozin-metFORMIN HCl (SYNJARDY) 12.5-500 MG TABS Take 1 tablet by mouth 2 (two) times daily.  Marland Kitchen ezetimibe (ZETIA) 10 MG tablet Take 1 tablet (10 mg total) by mouth daily.  . fluticasone (FLONASE) 50 MCG/ACT nasal spray Place 2 sprays into both nostrils daily.  .  isosorbide mononitrate (IMDUR) 30 MG 24 hr tablet Take 15 mg by mouth daily.  . metoprolol succinate (TOPROL-XL) 50 MG 24 hr tablet Take 1 tablet by mouth once daily  . nabumetone (RELAFEN) 750 MG tablet Take 1 tablet (750 mg total) by mouth 2 (two) times daily as needed.  . nitroGLYCERIN (NITROSTAT) 0.4 MG SL tablet Place under the tongue.  Marland Kitchen olopatadine (PATANOL) 0.1 % ophthalmic solution 1 drop as needed.  . ramipril (ALTACE) 10 MG capsule Take 1 capsule by mouth once daily  . rosuvastatin (CRESTOR) 40 MG tablet Take 1 tablet (40 mg total) by mouth daily.   No facility-administered encounter medications on file as of 01/26/2021.  :  Review of Systems:  Out of a complete 14 point review of systems, all are reviewed and negative with the exception of these symptoms as listed below: Review of Systems  Neurological:       Here for sleep consult. No prior sleep study snoring is present. Epworth Sleepiness Scale 0= would never doze 1= slight chance of dozing 2= moderate chance of dozing 3= high chance of dozing  Sitting and reading:1 Watching TV:1 Sitting inactive in a public place (ex. Theater or meeting):0 As a passenger in a car for an hour without a break:1 Lying down to rest in the afternoon:2 Sitting and talking to someone:0 Sitting quietly after lunch (no alcohol):1 In a car, while stopped in traffic:0 Total:6     Objective:  Neurological Exam  Physical Exam Physical Examination:   Vitals:   01/26/21 1321  BP: 124/78  Pulse: 60   General Examination: The patient is a very pleasant 65 y.o. male in no acute distress. He appears well-developed and well groomed.   HEENT: Normocephalic, atraumatic, pupils are equal, round and reactive to light, extraocular tracking is good without limitation to gaze excursion or nystagmus noted. Hearing is grossly intact. Face is symmetric with normal facial animation. Speech is clear with no dysarthria noted. There is no hypophonia. There  is no lip, neck/head, jaw or voice tremor. Neck is supple with full range of passive and active motion. There are no carotid bruits on auscultation. Oropharynx exam reveals: mild mouth dryness, good dental hygiene and mild airway crowding, due to smaller airway entry.  Mallampati class II.  Neck circumference of 18 inches.  Tonsils absent.  Tongue protrudes centrally in palate elevates symmetrically, he has a minimal overbite.  Chest: Clear to auscultation without wheezing, rhonchi or crackles noted.  Heart: S1+S2+0, regular and normal without murmurs, rubs or gallops noted.   Abdomen: Soft, non-tender and non-distended with normal bowel sounds appreciated on auscultation.  Extremities: There is trace pitting edema in the distal  lower extremities bilaterally.   Skin: Warm and dry without trophic changes noted.   Musculoskeletal: exam reveals no obvious joint deformities, tenderness or joint swelling or erythema.   Neurologically:  Mental status: The patient is awake, alert and oriented in all 4 spheres. His immediate and remote memory, attention, language skills and fund of knowledge are appropriate. There is no evidence of aphasia, agnosia, apraxia or anomia. Speech is clear with normal prosody and enunciation. Thought process is linear. Mood is normal and affect is normal.  Cranial nerves II - XII are as described above under HEENT exam.  Motor exam: Normal bulk, strength and tone is noted. There is no tremor, fine motor skills and coordination: grossly intact.  Cerebellar testing: No dysmetria or intention tremor. There is no truncal or gait ataxia.  Sensory exam: intact to light touch in the upper and lower extremities.  Gait, station and balance: He stands easily. No veering to one side is noted. No leaning to one side is noted. Posture is age-appropriate and stance is narrow based. Gait shows normal stride length and normal pace. No problems turning are noted.   Assessment and Plan:    In summary, ZEKE AKER is a very pleasant 65 y.o.-year old male with an underlying medical history of hypertension, hyperlipidemia, coronary artery disease, with status post two-vessel CABG in 2012, diabetes, gout, depression, anxiety, and obesity, whose history and physical exam are concerning for obstructive sleep apnea (OSA). I had a long chat with the patient about my findings and the diagnosis of OSA, its prognosis and treatment options. We talked about medical treatments, surgical interventions and non-pharmacological approaches. I explained in particular the risks and ramifications of untreated moderate to severe OSA, especially with respect to developing cardiovascular disease down the Road, including congestive heart failure, difficult to treat hypertension, cardiac arrhythmias, or stroke. Even type 2 diabetes has, in part, been linked to untreated OSA. Symptoms of untreated OSA include daytime sleepiness, memory problems, mood irritability and mood disorder such as depression and anxiety, lack of energy, as well as recurrent headaches, especially morning headaches. We talked about trying to maintain a healthy lifestyle in general, as well as the importance of weight control. We also talked about the importance of good sleep hygiene. I recommended the following at this time: sleep study.   I explained the sleep test procedure to the patient and also outlined possible surgical and non-surgical treatment options of OSA, including the use of a custom-made dental device (which would require a referral to a specialist dentist or oral surgeon), upper airway surgical options. I also explained the CPAP treatment option to the patient, who indicated that he would be willing to try CPAP if the need arises.  I answered all his questions today and the patient was in agreement. I plan to see him back after the sleep study is completed and encouraged him to call with any interim questions, concerns, problems  or updates.   Thank you very much for allowing me to participate in the care of this nice patient. If I can be of any further assistance to you please do not hesitate to call me at [phone].  Sincerely,   Shane Foley, MD, PhD

## 2021-01-30 ENCOUNTER — Other Ambulatory Visit: Payer: Self-pay

## 2021-01-30 ENCOUNTER — Ambulatory Visit (INDEPENDENT_AMBULATORY_CARE_PROVIDER_SITE_OTHER): Payer: PPO | Admitting: Family

## 2021-01-30 ENCOUNTER — Encounter: Payer: Self-pay | Admitting: Family

## 2021-01-30 VITALS — BP 138/90 | HR 66 | Temp 98.0°F | Ht 73.0 in | Wt 260.0 lb

## 2021-01-30 DIAGNOSIS — M109 Gout, unspecified: Secondary | ICD-10-CM

## 2021-01-30 DIAGNOSIS — R1012 Left upper quadrant pain: Secondary | ICD-10-CM

## 2021-01-30 DIAGNOSIS — R109 Unspecified abdominal pain: Secondary | ICD-10-CM

## 2021-01-30 LAB — CBC WITH DIFFERENTIAL/PLATELET
Basophils Absolute: 0.1 10*3/uL (ref 0.0–0.1)
Basophils Relative: 1 % (ref 0.0–3.0)
Eosinophils Absolute: 0.6 10*3/uL (ref 0.0–0.7)
Eosinophils Relative: 8.6 % — ABNORMAL HIGH (ref 0.0–5.0)
HCT: 41.3 % (ref 39.0–52.0)
Hemoglobin: 14.1 g/dL (ref 13.0–17.0)
Lymphocytes Relative: 29.9 % (ref 12.0–46.0)
Lymphs Abs: 2 10*3/uL (ref 0.7–4.0)
MCHC: 34.2 g/dL (ref 30.0–36.0)
MCV: 88.3 fl (ref 78.0–100.0)
Monocytes Absolute: 0.4 10*3/uL (ref 0.1–1.0)
Monocytes Relative: 6.8 % (ref 3.0–12.0)
Neutro Abs: 3.5 10*3/uL (ref 1.4–7.7)
Neutrophils Relative %: 53.7 % (ref 43.0–77.0)
Platelets: 195 10*3/uL (ref 150.0–400.0)
RBC: 4.68 Mil/uL (ref 4.22–5.81)
RDW: 15.1 % (ref 11.5–15.5)
WBC: 6.5 10*3/uL (ref 4.0–10.5)

## 2021-01-30 LAB — URINALYSIS
Bilirubin Urine: NEGATIVE
Hgb urine dipstick: NEGATIVE
Ketones, ur: NEGATIVE
Leukocytes,Ua: NEGATIVE
Nitrite: NEGATIVE
Specific Gravity, Urine: <=1.005 — AB (ref 1.000–1.030)
Total Protein, Urine: NEGATIVE
Urine Glucose: >=1000 — AB
Urobilinogen, UA: 0.2 (ref 0.0–1.0)
pH: 5.5 (ref 5.0–8.0)

## 2021-01-30 LAB — COMPREHENSIVE METABOLIC PANEL
ALT: 21 U/L (ref 0–53)
AST: 19 U/L (ref 0–37)
Albumin: 4.1 g/dL (ref 3.5–5.2)
Alkaline Phosphatase: 65 U/L (ref 39–117)
BUN: 19 mg/dL (ref 6–23)
CO2: 26 mEq/L (ref 19–32)
Calcium: 10.1 mg/dL (ref 8.4–10.5)
Chloride: 108 mEq/L (ref 96–112)
Creatinine, Ser: 1.22 mg/dL (ref 0.40–1.50)
GFR: 61.58 mL/min (ref >60.00)
Glucose, Bld: 157 mg/dL — ABNORMAL HIGH (ref 70–99)
Potassium: 4.1 mEq/L (ref 3.5–5.1)
Sodium: 141 mEq/L (ref 135–145)
Total Bilirubin: 0.4 mg/dL (ref 0.2–1.2)
Total Protein: 7.1 g/dL (ref 6.0–8.3)

## 2021-01-30 LAB — LIPASE: Lipase: 49 U/L (ref 11.0–59.0)

## 2021-01-30 LAB — AMYLASE: Amylase: 54 U/L (ref 27–131)

## 2021-01-30 LAB — URIC ACID: Uric Acid, Serum: 4.5 mg/dL (ref 4.0–7.8)

## 2021-01-30 NOTE — Progress Notes (Signed)
Shane Garrett is a 65 y.o. male with the following history as recorded in EpicCare:  Patient Active Problem List   Diagnosis Date Noted  . Idiopathic chronic gout of foot without tophus 12/21/2020  . Major depressive disorder with single episode, in full remission ([place]) 12/21/2020  . Primary hypertension 12/21/2020  . Type II diabetes mellitus with manifestations ([place]) 12/21/2020  . Loud snoring 12/21/2020  . Inclusion cyst 04/04/2020  . Healthcare maintenance 04/04/2020  . Essential hypertension 04/04/2020  . Unilateral primary osteoarthritis, left knee 08/05/2019  . Hx of CABG 04/15/2019  . Anxiety 12/01/2018  . Coronary artery disease of native artery of native heart with stable angina pectoris ([place]) 12/01/2018  . Gout 12/01/2018  . Hyperlipidemia 12/01/2018  . Hypogonadism in male 12/01/2018  . Type 2 diabetes mellitus without complication, without long-term current use of insulin ([place]) 12/01/2018    Current Outpatient Medications  Medication Sig Dispense Refill  . allopurinol (ZYLOPRIM) 100 MG tablet Take 1 tablet (100 mg total) by mouth daily. 90 tablet 1  . aspirin EC 81 MG tablet Take by mouth.    . citalopram (CELEXA) 40 MG tablet Take 1 tablet (40 mg total) by mouth daily. 90 tablet 1  . clonazePAM (KLONOPIN) 0.5 MG tablet TAKE 1 TABLET BY MOUTH ONCE DAILY AS NEEDED FOR ANXIETY 30 tablet 0  . colchicine 0.6 MG tablet Take by mouth as needed.     . Empagliflozin-metFORMIN HCl (SYNJARDY) 12.5-500 MG TABS Take 1 tablet by mouth 2 (two) times daily. 180 tablet 1  . ezetimibe (ZETIA) 10 MG tablet Take 1 tablet (10 mg total) by mouth daily. 90 tablet 3  . fluticasone (FLONASE) 50 MCG/ACT nasal spray Place 2 sprays into both nostrils daily. 16 g 6  . isosorbide mononitrate (IMDUR) 30 MG 24 hr tablet Take 15 mg by mouth daily.    . metoprolol succinate (TOPROL-XL) 50 MG 24 hr tablet Take 1 tablet by mouth once daily 90 tablet 0  . nitroGLYCERIN (NITROSTAT) 0.4 MG SL tablet Place  under the tongue.    Marland Kitchen olopatadine (PATANOL) 0.1 % ophthalmic solution 1 drop as needed.    . ramipril (ALTACE) 10 MG capsule Take 1 capsule by mouth once daily 90 capsule 0  . rosuvastatin (CRESTOR) 40 MG tablet Take 1 tablet (40 mg total) by mouth daily. 90 tablet 1   No current facility-administered medications for this visit.    Allergies: Patient has no known allergies.  Past Medical History:  Diagnosis Date  . Anxiety   . CAD (coronary artery disease)    2V CABG in 2012  . Diabetes mellitus without complication ([place])   . Hyperlipidemia   . Hypertension     Past Surgical History:  Procedure Laterality Date  . CORONARY ARTERY BYPASS GRAFT     2V CABG in [place]    Family History  Problem Relation Age of Onset  . Cancer Mother   . Heart attack Father 52  . Diabetes Brother     Social History   Tobacco Use  . Smoking status: Never Smoker  . Smokeless tobacco: Never Used  Substance Use Topics  . Alcohol use: Never    Subjective:   LUQ pain x 3 weeks; seemed to start with no specific trigger; no nausea, vomiting/ no changes in bowel movements; thinks symptoms may have started after doing some yard work; he was concerned that Iva Boop was source but has tried holding medication and no relief; no chest pain or shortness  of breath on exertion; last colonoscopy was in 2018 in [place]- no records available for review today;     Objective:  Vitals:   01/30/21 0956  BP: 138/90  Pulse: 66  Temp: 98 F (36.7 C)  TempSrc: Oral  SpO2: 97%  Weight: 260 lb (117.9 kg)  Height: $Remove'6\' 1"'IXRbwbx$  (1.854 m)    General: Well developed, well nourished, in no acute distress  Skin : Warm and dry.  Head: Normocephalic and atraumatic  Eyes: Sclera and conjunctiva clear; pupils round and reactive to light; extraocular movements intact  Ears: External normal; canals clear; tympanic membranes normal  Oropharynx: Pink, supple. No suspicious lesions  Neck: Supple without thyromegaly, adenopathy   Lungs: Respirations unlabored; clear to auscultation bilaterally without wheeze, rales, rhonchi  CVS exam: normal rate and regular rhythm.  Abdomen: Soft; nontender; nondistended; normoactive bowel sounds; no masses or hepatosplenomegaly  Musculoskeletal: No deformities; no active joint inflammation  Extremities: No edema, cyanosis, clubbing  Vessels: Symmetric bilaterally  Neurologic: Alert and oriented; speech intact; face symmetrical; moves all extremities well; CNII-XII intact without focal deficit   Assessment:  1. LUQ pain   2. Gout, unspecified cause, unspecified chronicity, unspecified site   3. Left flank pain     Plan:  1. Update labs today today- ? Gallbladder or pancreas source; ? Related to recent start of Synjardy but no relief from pain when he has held medication; check abdominal/ pelvic CT as well; follow up to be determined; 2. Check uric acid level- will determine if correct dosage of Allopurinol; patient feels he has been doing better since starting medication. 3. Check U/A- ? Atypical presentation for kidney stone; CT would evaluate this as well;  This visit occurred during the SARS-CoV-2 public health emergency.  Safety protocols were in place, including screening questions prior to the visit, additional usage of staff PPE, and extensive cleaning of exam room while observing appropriate contact time as indicated for disinfecting solutions.     No follow-ups on file.  Orders Placed This Encounter  Procedures  . CT Abdomen Pelvis W Contrast    Standing Status:   Future    Standing Expiration Date:   01/30/2022    Order Specific Question:   If indicated for the ordered procedure, I authorize the administration of contrast media per Radiology protocol    Answer:   Yes    Order Specific Question:   Preferred imaging location?    Answer:   [facility]    Order Specific Question:   Is Oral Contrast requested for this exam?    Answer:   Yes, Per Radiology  protocol  . CBC with Differential/Platelet    Standing Status:   Future    Number of Occurrences:   1    Standing Expiration Date:   01/30/2022  . Comp Met (CMET)    Standing Status:   Future    Number of Occurrences:   1    Standing Expiration Date:   01/30/2022  . Amylase    Standing Status:   Future    Number of Occurrences:   1    Standing Expiration Date:   01/30/2022  . Lipase    Standing Status:   Future    Number of Occurrences:   1    Standing Expiration Date:   01/30/2022  . Uric acid    Standing Status:   Future    Number of Occurrences:   1    Standing Expiration Date:  01/30/2022  . Urinalysis    Standing Status:   Future    Number of Occurrences:   1    Standing Expiration Date:   01/30/2022    Requested Prescriptions    No prescriptions requested or ordered in this encounter

## 2021-02-02 ENCOUNTER — Telehealth: Payer: Self-pay

## 2021-02-02 NOTE — Telephone Encounter (Signed)
LVM for pt to call me back to schedule sleep study  

## 2021-02-06 ENCOUNTER — Telehealth: Payer: Self-pay

## 2021-02-06 NOTE — Telephone Encounter (Signed)
LVM for pt to call me back to schedule sleep study  

## 2021-02-08 ENCOUNTER — Telehealth: Payer: Self-pay

## 2021-02-08 NOTE — Telephone Encounter (Signed)
We have attempted to call the patient two times to schedule sleep study.  Patient has been unavailable at the phone numbers we have on file and has not returned our calls. If patient calls back we will schedule them for their sleep study.  

## 2021-02-14 ENCOUNTER — Other Ambulatory Visit: Payer: PPO

## 2021-02-23 ENCOUNTER — Other Ambulatory Visit: Payer: Self-pay | Admitting: Family

## 2021-02-23 DIAGNOSIS — I10 Essential (primary) hypertension: Secondary | ICD-10-CM

## 2021-04-03 ENCOUNTER — Encounter: Payer: Self-pay | Admitting: Internal Medicine

## 2021-04-03 ENCOUNTER — Other Ambulatory Visit (HOSPITAL_COMMUNITY): Payer: Self-pay

## 2021-04-03 ENCOUNTER — Telehealth (INDEPENDENT_AMBULATORY_CARE_PROVIDER_SITE_OTHER): Payer: PPO | Admitting: Internal Medicine

## 2021-04-03 DIAGNOSIS — U071 COVID-19: Secondary | ICD-10-CM | POA: Insufficient documentation

## 2021-04-03 MED ORDER — PROMETHAZINE-DM 6.25-15 MG/5ML PO SYRP
5.0000 mL | ORAL_SOLUTION | Freq: Four times a day (QID) | ORAL | 0 refills | Status: DC | PRN
  Filled 2021-04-03: qty 118, 5d supply, fill #0

## 2021-04-03 MED ORDER — FLUTICASONE PROPIONATE 50 MCG/ACT NA SUSP
2.0000 | Freq: Every day | NASAL | 0 refills | Status: DC
  Filled 2021-04-03: qty 16, 30d supply, fill #0

## 2021-04-03 MED ORDER — NIRMATRELVIR/RITONAVIR (PAXLOVID)TABLET
3.0000 | ORAL_TABLET | Freq: Two times a day (BID) | ORAL | 0 refills | Status: AC
  Filled 2021-04-03: qty 30, 5d supply, fill #0

## 2021-04-03 NOTE — Progress Notes (Signed)
Virtual Visit via Video Note  I connected with Shane Garrett on 04/03/21 at  8:40 AM EDT by a video enabled telemedicine application and verified that I am speaking with the correct person using two identifiers.  The patient and the provider were at separate locations throughout the entire encounter. Patient location: home, Provider location: work   I discussed the limitations of evaluation and management by telemedicine and the availability of in person appointments. The patient expressed understanding and agreed to proceed. The patient and the provider were the only parties present for the visit unless noted in HPI below.  History of Present Illness: The patient is a 65 y.o. man with visit for covid-19 positive. Started last Thursday. Has been getting worse every day. Denies SOB. Overall it is worsening. Has not gotten any covid-19 shots. Has been taking advil but this is not helping. Breathing is okay but some congestion which is keeping him coughing. Denies fevers or chills. Denies muscle aches. He denies that he has been vaccinated against covid-19 although we have listed 2 shots in our files he does deny this. Has concurrent CAD and DM and HTN and is >65.   Observations/Objective: Appearance: appears unwell, breathing appears normal, minimal coughing during visit, casual grooming, abdomen does not appear distended, throat not well visualized, mental status is A and O times 3  Assessment and Plan: See problem oriented charting  Follow Up Instructions: rx paxlovid, flonase, promethazine/dm, wife will pick up medications and knows where [facility] located  I discussed the assessment and treatment plan with the patient. The patient was provided an opportunity to ask questions and all were answered. The patient agreed with the plan and demonstrated an understanding of the instructions.   The patient was advised to call back or seek an in-person evaluation if the symptoms worsen or if  the condition fails to improve as anticipated.  Myrlene Broker, MD

## 2021-04-03 NOTE — Assessment & Plan Note (Signed)
With moderate symptoms and unvaccinated with several high risk conditions. He is on day 5 today and can start paxlovid today. Rx paxlovid and promethazine/dm and flonase.

## 2021-04-10 ENCOUNTER — Other Ambulatory Visit: Payer: Self-pay | Admitting: Family Medicine

## 2021-04-10 DIAGNOSIS — E785 Hyperlipidemia, unspecified: Secondary | ICD-10-CM

## 2021-04-10 DIAGNOSIS — I25118 Atherosclerotic heart disease of native coronary artery with other forms of angina pectoris: Secondary | ICD-10-CM

## 2021-04-21 DIAGNOSIS — E119 Type 2 diabetes mellitus without complications: Secondary | ICD-10-CM | POA: Diagnosis not present

## 2021-04-21 LAB — HM DIABETES EYE EXAM

## 2021-05-24 ENCOUNTER — Other Ambulatory Visit: Payer: Self-pay | Admitting: Family

## 2021-05-24 DIAGNOSIS — I10 Essential (primary) hypertension: Secondary | ICD-10-CM

## 2021-05-31 NOTE — Telephone Encounter (Signed)
   Patient requesting status of refill. Patient requesting Dr Yetta Barre to refill

## 2021-06-05 ENCOUNTER — Other Ambulatory Visit: Payer: Self-pay | Admitting: Family

## 2021-06-05 DIAGNOSIS — I10 Essential (primary) hypertension: Secondary | ICD-10-CM

## 2021-06-06 ENCOUNTER — Other Ambulatory Visit: Payer: Self-pay | Admitting: Internal Medicine

## 2021-06-06 DIAGNOSIS — I10 Essential (primary) hypertension: Secondary | ICD-10-CM

## 2021-06-06 MED ORDER — RAMIPRIL 10 MG PO CAPS: 10.0000 mg | ORAL_CAPSULE | Freq: Every day | ORAL | 0 refills | Status: DC

## 2021-06-06 MED ORDER — METOPROLOL SUCCINATE ER 50 MG PO TB24: 50.0000 mg | ORAL_TABLET | Freq: Every day | ORAL | 0 refills | Status: DC

## 2021-06-06 NOTE — Telephone Encounter (Signed)
    Patient calling for status of refill. He has been without medication for 1 week

## 2021-06-23 ENCOUNTER — Other Ambulatory Visit: Payer: Self-pay | Admitting: Internal Medicine

## 2021-06-23 DIAGNOSIS — M1A0790 Idiopathic chronic gout, unspecified ankle and foot, without tophus (tophi): Secondary | ICD-10-CM

## 2021-06-23 DIAGNOSIS — F325 Major depressive disorder, single episode, in full remission: Secondary | ICD-10-CM

## 2021-07-04 ENCOUNTER — Other Ambulatory Visit: Payer: Self-pay | Admitting: Internal Medicine

## 2021-07-04 ENCOUNTER — Other Ambulatory Visit: Payer: Self-pay | Admitting: Family Medicine

## 2021-07-04 DIAGNOSIS — E785 Hyperlipidemia, unspecified: Secondary | ICD-10-CM

## 2021-07-04 DIAGNOSIS — E118 Type 2 diabetes mellitus with unspecified complications: Secondary | ICD-10-CM

## 2021-07-04 DIAGNOSIS — I25118 Atherosclerotic heart disease of native coronary artery with other forms of angina pectoris: Secondary | ICD-10-CM

## 2021-07-07 ENCOUNTER — Telehealth: Payer: Self-pay

## 2021-07-07 NOTE — Telephone Encounter (Signed)
pt has asked for a rx refill for his Empagliflozin-metFORMIN HCl (SYNJARDY) 12.5-500 MG  and ezetimibe (ZETIA) 10 MG tablet. Pharmacy has stated the medications were denied and he does not know why as he is in need of his medications. Pt asked can you please put more then 1 refill on these medications to possibly help with the keep having to have rx sent out so often.

## 2021-07-07 NOTE — Telephone Encounter (Signed)
OV has been scheduled for 8/17 @ 9.20am  Per PCP denial reason on 8/9 refill encounter was pt needed an OV

## 2021-07-12 ENCOUNTER — Ambulatory Visit (INDEPENDENT_AMBULATORY_CARE_PROVIDER_SITE_OTHER): Payer: PPO | Admitting: Internal Medicine

## 2021-07-12 ENCOUNTER — Other Ambulatory Visit: Payer: Self-pay

## 2021-07-12 ENCOUNTER — Encounter: Payer: Self-pay | Admitting: Internal Medicine

## 2021-07-12 VITALS — BP 118/80 | HR 72 | Temp 98.2°F | Resp 18 | Ht 73.0 in | Wt 252.0 lb

## 2021-07-12 DIAGNOSIS — I10 Essential (primary) hypertension: Secondary | ICD-10-CM

## 2021-07-12 DIAGNOSIS — Z23 Encounter for immunization: Secondary | ICD-10-CM

## 2021-07-12 DIAGNOSIS — E119 Type 2 diabetes mellitus without complications: Secondary | ICD-10-CM | POA: Diagnosis not present

## 2021-07-12 DIAGNOSIS — E118 Type 2 diabetes mellitus with unspecified complications: Secondary | ICD-10-CM | POA: Diagnosis not present

## 2021-07-12 DIAGNOSIS — I25118 Atherosclerotic heart disease of native coronary artery with other forms of angina pectoris: Secondary | ICD-10-CM | POA: Diagnosis not present

## 2021-07-12 DIAGNOSIS — Z0001 Encounter for general adult medical examination with abnormal findings: Secondary | ICD-10-CM | POA: Diagnosis not present

## 2021-07-12 DIAGNOSIS — E785 Hyperlipidemia, unspecified: Secondary | ICD-10-CM

## 2021-07-12 LAB — CBC WITH DIFFERENTIAL/PLATELET
Basophils Absolute: 0.1 10*3/uL (ref 0.0–0.1)
Basophils Relative: 1.1 % (ref 0.0–3.0)
Eosinophils Absolute: 0.6 10*3/uL (ref 0.0–0.7)
Eosinophils Relative: 10.8 % — ABNORMAL HIGH (ref 0.0–5.0)
HCT: 43.3 % (ref 39.0–52.0)
Hemoglobin: 14.1 g/dL (ref 13.0–17.0)
Lymphocytes Relative: 27.9 % (ref 12.0–46.0)
Lymphs Abs: 1.6 10*3/uL (ref 0.7–4.0)
MCHC: 32.7 g/dL (ref 30.0–36.0)
MCV: 88.6 fl (ref 78.0–100.0)
Monocytes Absolute: 0.4 10*3/uL (ref 0.1–1.0)
Monocytes Relative: 7.4 % (ref 3.0–12.0)
Neutro Abs: 3 10*3/uL (ref 1.4–7.7)
Neutrophils Relative %: 52.8 % (ref 43.0–77.0)
Platelets: 160 10*3/uL (ref 150.0–400.0)
RBC: 4.88 Mil/uL (ref 4.22–5.81)
RDW: 15.7 % — ABNORMAL HIGH (ref 11.5–15.5)
WBC: 5.6 10*3/uL (ref 4.0–10.5)

## 2021-07-12 LAB — URINALYSIS, ROUTINE W REFLEX MICROSCOPIC
Bilirubin Urine: NEGATIVE
Hgb urine dipstick: NEGATIVE
Ketones, ur: NEGATIVE
Leukocytes,Ua: NEGATIVE
Nitrite: NEGATIVE
RBC / HPF: NONE SEEN (ref 0–?)
Specific Gravity, Urine: 1.015 (ref 1.000–1.030)
Total Protein, Urine: NEGATIVE
Urine Glucose: NEGATIVE
Urobilinogen, UA: 0.2 (ref 0.0–1.0)
WBC, UA: NONE SEEN (ref 0–?)
pH: 6 (ref 5.0–8.0)

## 2021-07-12 LAB — BASIC METABOLIC PANEL
BUN: 15 mg/dL (ref 6–23)
CO2: 28 mEq/L (ref 19–32)
Calcium: 9.9 mg/dL (ref 8.4–10.5)
Chloride: 108 mEq/L (ref 96–112)
Creatinine, Ser: 1.11 mg/dL (ref 0.40–1.50)
GFR: 68.76 mL/min (ref >60.00)
Glucose, Bld: 118 mg/dL — ABNORMAL HIGH (ref 70–99)
Potassium: 4.3 mEq/L (ref 3.5–5.1)
Sodium: 142 mEq/L (ref 135–145)

## 2021-07-12 LAB — HEPATIC FUNCTION PANEL
ALT: 37 U/L (ref 0–53)
AST: 36 U/L (ref 0–37)
Albumin: 3.8 g/dL (ref 3.5–5.2)
Alkaline Phosphatase: 58 U/L (ref 39–117)
Bilirubin, Direct: 0.1 mg/dL (ref 0.0–0.3)
Total Bilirubin: 0.5 mg/dL (ref 0.2–1.2)
Total Protein: 6.7 g/dL (ref 6.0–8.3)

## 2021-07-12 LAB — LIPID PANEL
Cholesterol: 109 mg/dL (ref 0–200)
HDL: 35.8 mg/dL — ABNORMAL LOW (ref >39.00)
LDL Cholesterol: 54 mg/dL (ref 0–99)
NonHDL: 73.17
Total CHOL/HDL Ratio: 3
Triglycerides: 96 mg/dL (ref 0.0–149.0)
VLDL: 19.2 mg/dL (ref 0.0–40.0)

## 2021-07-12 LAB — TSH: TSH: 2.89 u[IU]/mL (ref 0.35–5.50)

## 2021-07-12 LAB — MICROALBUMIN / CREATININE URINE RATIO
Creatinine,U: 87.7 mg/dL
Microalb Creat Ratio: 0.8 mg/g (ref 0.0–30.0)
Microalb, Ur: <0.7 mg/dL (ref 0.0–1.9)

## 2021-07-12 LAB — HEMOGLOBIN A1C: Hgb A1c MFr Bld: 7.6 % — ABNORMAL HIGH (ref 4.6–6.5)

## 2021-07-12 MED ORDER — EZETIMIBE 10 MG PO TABS: 10.0000 mg | ORAL_TABLET | Freq: Every day | ORAL | 1 refills | Status: DC

## 2021-07-12 MED ORDER — RYBELSUS 3 MG PO TABS: 3.0000 mg | ORAL_TABLET | Freq: Every day | ORAL | 0 refills | Status: AC

## 2021-07-12 MED ORDER — SYNJARDY 12.5-500 MG PO TABS: 1.0000 | ORAL_TABLET | Freq: Two times a day (BID) | ORAL | 1 refills | Status: DC

## 2021-07-12 NOTE — Patient Instructions (Signed)
Health Maintenance, Male Adopting a healthy lifestyle and getting preventive care are important in promoting health and wellness. Ask your health care provider about: The right schedule for you to have regular tests and exams. Things you can do on your own to prevent diseases and keep yourself healthy. What should I know about diet, weight, and exercise? Eat a healthy diet  Eat a diet that includes plenty of vegetables, fruits, low-fat dairy products, and lean protein. Do not eat a lot of foods that are high in solid fats, added sugars, or sodium.  Maintain a healthy weight Body mass index (BMI) is a measurement that can be used to identify possible weight problems. It estimates body fat based on height and weight. Your health care provider can help determine your BMI and help you achieve or maintain ahealthy weight. Get regular exercise Get regular exercise. This is one of the most important things you can do for your health. Most adults should: Exercise for at least 150 minutes each week. The exercise should increase your heart rate and make you sweat (moderate-intensity exercise). Do strengthening exercises at least twice a week. This is in addition to the moderate-intensity exercise. Spend less time sitting. Even light physical activity can be beneficial. Watch cholesterol and blood lipids Have your blood tested for lipids and cholesterol at 67 years of age, then havethis test every 5 years. You may need to have your cholesterol levels checked more often if: Your lipid or cholesterol levels are high. You are older than 67 years of age. You are at high risk for heart disease. What should I know about cancer screening? Many types of cancers can be detected early and may often be prevented. Depending on your health history and family history, you may need to have cancer screening at various ages. This may include screening for: Colorectal cancer. Prostate cancer. Skin cancer. Lung  cancer. What should I know about heart disease, diabetes, and high blood pressure? Blood pressure and heart disease High blood pressure causes heart disease and increases the risk of stroke. This is more likely to develop in people who have high blood pressure readings, are of African descent, or are overweight. Talk with your health care provider about your target blood pressure readings. Have your blood pressure checked: Every 3-5 years if you are 18-39 years of age. Every year if you are 40 years old or older. If you are between the ages of 65 and 75 and are a current or former smoker, ask your health care provider if you should have a one-time screening for abdominal aortic aneurysm (AAA). Diabetes Have regular diabetes screenings. This checks your fasting blood sugar level. Have the screening done: Once every three years after age 45 if you are at a normal weight and have a low risk for diabetes. More often and at a younger age if you are overweight or have a high risk for diabetes. What should I know about preventing infection? Hepatitis B If you have a higher risk for hepatitis B, you should be screened for this virus. Talk with your health care provider to find out if you are at risk forhepatitis B infection. Hepatitis C Blood testing is recommended for: Everyone born from 1945 through 1965. Anyone with known risk factors for hepatitis C. Sexually transmitted infections (STIs) You should be screened each year for STIs, including gonorrhea and chlamydia, if: You are sexually active and are younger than 67 years of age. You are older than 67 years of age   and your health care provider tells you that you are at risk for this type of infection. Your sexual activity has changed since you were last screened, and you are at increased risk for chlamydia or gonorrhea. Ask your health care provider if you are at risk. Ask your health care provider about whether you are at high risk for HIV.  Your health care provider may recommend a prescription medicine to help prevent HIV infection. If you choose to take medicine to prevent HIV, you should first get tested for HIV. You should then be tested every 3 months for as long as you are taking the medicine. Follow these instructions at home: Lifestyle Do not use any products that contain nicotine or tobacco, such as cigarettes, e-cigarettes, and chewing tobacco. If you need help quitting, ask your health care provider. Do not use street drugs. Do not share needles. Ask your health care provider for help if you need support or information about quitting drugs. Alcohol use Do not drink alcohol if your health care provider tells you not to drink. If you drink alcohol: Limit how much you have to 0-2 drinks a day. Be aware of how much alcohol is in your drink. In the [place] one drink equals one 12 oz bottle of beer (355 mL), one 5 oz glass of wine (148 mL), or one 1 oz glass of hard liquor (44 mL). General instructions Schedule regular health, dental, and eye exams. Stay current with your vaccines. Tell your health care provider if: You often feel depressed. You have ever been abused or do not feel safe at home. Summary Adopting a healthy lifestyle and getting preventive care are important in promoting health and wellness. Follow your health care provider's instructions about healthy diet, exercising, and getting tested or screened for diseases. Follow your health care provider's instructions on monitoring your cholesterol and blood pressure. This information is not intended to replace advice given to you by your health care provider. Make sure you discuss any questions you have with your healthcare provider. Document Revised: 11/05/2018 Document Reviewed: 11/05/2018 Elsevier Patient Education  2022 Elsevier Inc.  

## 2021-07-12 NOTE — Progress Notes (Signed)
Subjective:  Patient ID: Shane Garrett, male    DOB: Feb 07, 1956  Age: 65 y.o. MRN: [id number]  CC: Annual Exam, Hypertension, Hyperlipidemia, Diabetes, and Coronary Artery Disease  This visit occurred during the SARS-CoV-2 public health emergency.  Safety protocols were in place, including screening questions prior to the visit, additional usage of staff PPE, and extensive cleaning of exam room while observing appropriate contact time as indicated for disinfecting solutions.    HPI Shane Garrett presents for a CPX.  He is active and denies any recent episodes of chest pain, shortness of breath, diaphoresis, dizziness, lightheadedness, edema, or polys.  Outpatient Medications Prior to Visit  Medication Sig Dispense Refill   allopurinol (ZYLOPRIM) 100 MG tablet Take 1 tablet by mouth once daily 90 tablet 0   aspirin EC 81 MG tablet Take by mouth.     citalopram (CELEXA) 40 MG tablet Take 1 tablet by mouth once daily 90 tablet 0   colchicine 0.6 MG tablet Take by mouth as needed.      fluticasone (FLONASE) 50 MCG/ACT nasal spray Place 2 sprays into both nostrils daily. 16 g 0   isosorbide mononitrate (IMDUR) 30 MG 24 hr tablet Take 15 mg by mouth daily.     metoprolol succinate (TOPROL-XL) 50 MG 24 hr tablet Take 1 tablet (50 mg total) by mouth daily. Take with or immediately following a meal. 90 tablet 0   nitroGLYCERIN (NITROSTAT) 0.4 MG SL tablet Place under the tongue.     olopatadine (PATANOL) 0.1 % ophthalmic solution 1 drop as needed.     ramipril (ALTACE) 10 MG capsule Take 1 capsule (10 mg total) by mouth daily. 90 capsule 0   rosuvastatin (CRESTOR) 40 MG tablet Take 1 tablet (40 mg total) by mouth daily. 90 tablet 1   Empagliflozin-metFORMIN HCl (SYNJARDY) 12.5-500 MG TABS Take 1 tablet by mouth 2 (two) times daily. 180 tablet 1   ezetimibe (ZETIA) 10 MG tablet Take 1 tablet (10 mg total) by mouth daily. **needs appt before next refill** 90 tablet 0   No facility-administered  medications prior to visit.    ROS Review of Systems  Constitutional:  Negative for chills, diaphoresis, fatigue and fever.  HENT: Negative.    Respiratory:  Negative for cough, chest tightness and wheezing.   Cardiovascular:  Negative for chest pain, palpitations and leg swelling.  Gastrointestinal:  Negative for abdominal pain, constipation, diarrhea, nausea and vomiting.  Endocrine: Negative.   Genitourinary: Negative.  Negative for difficulty urinating, scrotal swelling and testicular pain.  Musculoskeletal:  Negative for arthralgias and myalgias.  Skin:  Negative for color change and pallor.  Neurological: Negative.  Negative for dizziness, weakness, light-headedness, numbness and headaches.  Hematological:  Negative for adenopathy. Does not bruise/bleed easily.   Objective:  BP 118/80 (BP Location: Left Arm, Patient Position: Sitting, Cuff Size: Large)   Pulse 72   Temp 98.2 F (36.8 C) (Oral)   Resp 18   Ht 6\' 1"  (1.854 m)   Wt 252 lb (114.3 kg)   SpO2 94%   BMI 33.25 kg/m   BP Readings from Last 3 Encounters:  07/12/21 118/80  01/30/21 138/90  01/26/21 124/78    Wt Readings from Last 3 Encounters:  07/12/21 252 lb (114.3 kg)  01/30/21 260 lb (117.9 kg)  01/26/21 262 lb (118.8 kg)    Physical Exam Vitals reviewed.  HENT:     Nose: Nose normal.     Mouth/Throat:     Mouth: Mucous  membranes are moist.  Eyes:     General: No scleral icterus.    Conjunctiva/sclera: Conjunctivae normal.  Cardiovascular:     Rate and Rhythm: Normal rate and regular rhythm.     Heart sounds: No murmur heard. Pulmonary:     Effort: Pulmonary effort is normal.     Breath sounds: No stridor. No wheezing, rhonchi or rales.  Abdominal:     Palpations: There is no mass.     Tenderness: There is no abdominal tenderness. There is no right CVA tenderness or guarding.     Hernia: No hernia is present. There is no hernia in the left inguinal area or right inguinal area.   Genitourinary:    Pubic Area: No rash.      Testes: Normal.     Epididymis:     Right: Normal.     Left: Normal.     Prostate: Normal. Not enlarged, not tender and no nodules present.     Rectum: Normal. Guaiac result negative. No mass, tenderness, anal fissure, external hemorrhoid or internal hemorrhoid. Normal anal tone.  Musculoskeletal:     Right lower leg: No edema.     Left lower leg: No edema.  Lymphadenopathy:     Cervical: No cervical adenopathy.     Lower Body: No right inguinal adenopathy. No left inguinal adenopathy.  Skin:    General: Skin is warm and dry.  Neurological:     General: No focal deficit present.     Mental Status: He is alert. Mental status is at baseline.  Psychiatric:        Mood and Affect: Mood normal.        Behavior: Behavior normal.    Lab Results  Component Value Date   WBC 5.6 07/12/2021   HGB 14.1 07/12/2021   HCT 43.3 07/12/2021   PLT 160.0 07/12/2021   GLUCOSE 118 (H) 07/12/2021   CHOL 109 07/12/2021   TRIG 96.0 07/12/2021   HDL 35.80 (L) 07/12/2021   LDLDIRECT 67.0 04/14/2020   LDLCALC 54 07/12/2021   ALT 37 07/12/2021   AST 36 07/12/2021   NA 142 07/12/2021   K 4.3 07/12/2021   CL 108 07/12/2021   CREATININE 1.11 07/12/2021   BUN 15 07/12/2021   CO2 28 07/12/2021   TSH 2.89 07/12/2021   PSA 2.73 04/14/2020   HGBA1C 7.6 (H) 07/12/2021   MICROALBUR <0.7 07/12/2021    No results found.  Assessment & Plan:   Shane Garrett was seen today for annual exam, hypertension, hyperlipidemia, diabetes and coronary artery disease.  Diagnoses and all orders for this visit:  Primary hypertension- His blood pressure is adequately well controlled. -     CBC with Differential/Platelet; Future -     Basic metabolic panel; Future -     TSH; Future -     Urinalysis, Routine w reflex microscopic; Future -     Urinalysis, Routine w reflex microscopic -     TSH -     Basic metabolic panel -     CBC with Differential/Platelet  Coronary  artery disease of native artery of native heart with stable angina pectoris (HCC)- He has had no recent episodes of angina. -     Lipid panel; Future -     ezetimibe (ZETIA) 10 MG tablet; Take 1 tablet (10 mg total) by mouth daily. -     Lipid panel -     Semaglutide (RYBELSUS) 3 MG TABS; Take 3 mg by mouth daily.  Type  2 diabetes mellitus without complication, without long-term current use of insulin (HCC)- His A1c is up to 7.6%.  Will add a of the GLP-1 agonist to his current regimen. -     Hemoglobin A1c; Future -     Microalbumin / creatinine urine ratio; Future -     Microalbumin / creatinine urine ratio -     Hemoglobin A1c -     Semaglutide (RYBELSUS) 3 MG TABS; Take 3 mg by mouth daily.  Hyperlipidemia with target LDL less than 70- LDL goal achieved. Doing well on the statin  -     Lipid panel; Future -     TSH; Future -     Hepatic function panel; Future -     Hepatic function panel -     TSH -     Lipid panel  Encounter for general adult medical examination with abnormal findings- Exam completed, labs reviewed, vaccines reviewed and updated, cancer screenings are up-to-date, patient education was given.  Type II diabetes mellitus with manifestations (HCC) -     Empagliflozin-metFORMIN HCl (SYNJARDY) 12.5-500 MG TABS; Take 1 tablet by mouth 2 (two) times daily.  Hyperlipidemia, unspecified hyperlipidemia type -     ezetimibe (ZETIA) 10 MG tablet; Take 1 tablet (10 mg total) by mouth daily.  Other orders -     Tdap vaccine greater than or equal to 7yo IM  I have changed Shane Reeves "Steve"'s ezetimibe. I am also having him start on Rybelsus. Additionally, I am having him maintain his aspirin EC, nitroGLYCERIN, olopatadine, colchicine, isosorbide mononitrate, rosuvastatin, fluticasone, metoprolol succinate, ramipril, allopurinol, citalopram, and Synjardy.  Meds ordered this encounter  Medications   Empagliflozin-metFORMIN HCl (SYNJARDY) 12.5-500 MG TABS    Sig: Take 1  tablet by mouth 2 (two) times daily.    Dispense:  180 tablet    Refill:  1   ezetimibe (ZETIA) 10 MG tablet    Sig: Take 1 tablet (10 mg total) by mouth daily.    Dispense:  90 tablet    Refill:  1   Semaglutide (RYBELSUS) 3 MG TABS    Sig: Take 3 mg by mouth daily.    Dispense:  30 tablet    Refill:  0      Follow-up: Return in about 6 months (around 01/12/2022).  Sanda Linger, MD

## 2021-07-20 ENCOUNTER — Other Ambulatory Visit: Payer: Self-pay | Admitting: Internal Medicine

## 2021-07-20 ENCOUNTER — Telehealth: Payer: Self-pay

## 2021-07-20 DIAGNOSIS — E119 Type 2 diabetes mellitus without complications: Secondary | ICD-10-CM

## 2021-07-20 MED ORDER — METFORMIN HCL ER 750 MG PO TB24: 1500.0000 mg | ORAL_TABLET | Freq: Every day | ORAL | 1 refills | Status: DC

## 2021-07-20 NOTE — Telephone Encounter (Signed)
Patient states Empagliflozin-metFORMIN HCl (SYNJARDY) 12.5-500 MG TABS has doubled in price and would like to be prescribed something else.

## 2021-07-21 ENCOUNTER — Other Ambulatory Visit: Payer: Self-pay | Admitting: Internal Medicine

## 2021-07-21 ENCOUNTER — Other Ambulatory Visit: Payer: Self-pay | Admitting: Family Medicine

## 2021-07-21 DIAGNOSIS — E785 Hyperlipidemia, unspecified: Secondary | ICD-10-CM

## 2021-07-21 DIAGNOSIS — I25118 Atherosclerotic heart disease of native coronary artery with other forms of angina pectoris: Secondary | ICD-10-CM

## 2021-07-26 ENCOUNTER — Telehealth: Payer: Self-pay | Admitting: Internal Medicine

## 2021-07-26 NOTE — Telephone Encounter (Signed)
1.Medication Requested: isosorbide mononitrate (IMDUR) 30 MG 24 hr tablet  2. Pharmacy (Name, Street, [facility]): [facility] [phone] - [place], [place] - [address] AT [facility] [address]  Phone:  [phone] Fax:  [phone]   3. On Med List: yes  4. Last Visit with PCP: 08.17.22  5. Next visit date with PCP: n/a   Agent: Please be advised that RX refills may take up to 3 business days. We ask that you follow-up with your pharmacy.

## 2021-07-26 NOTE — Telephone Encounter (Signed)
Rx was filled on 8/26 by Dr. Doreene Burke per [facility]

## 2021-07-26 NOTE — Telephone Encounter (Signed)
Patient has new PCP

## 2021-09-04 ENCOUNTER — Other Ambulatory Visit: Payer: Self-pay

## 2021-09-04 DIAGNOSIS — M1A0790 Idiopathic chronic gout, unspecified ankle and foot, without tophus (tophi): Secondary | ICD-10-CM

## 2021-09-05 ENCOUNTER — Other Ambulatory Visit: Payer: Self-pay | Admitting: Internal Medicine

## 2021-09-05 DIAGNOSIS — F325 Major depressive disorder, single episode, in full remission: Secondary | ICD-10-CM

## 2021-09-05 DIAGNOSIS — M1A0790 Idiopathic chronic gout, unspecified ankle and foot, without tophus (tophi): Secondary | ICD-10-CM

## 2021-09-05 DIAGNOSIS — I10 Essential (primary) hypertension: Secondary | ICD-10-CM

## 2021-09-06 ENCOUNTER — Other Ambulatory Visit: Payer: Self-pay | Admitting: Internal Medicine

## 2021-09-06 DIAGNOSIS — I10 Essential (primary) hypertension: Secondary | ICD-10-CM

## 2021-09-06 MED ORDER — RAMIPRIL 10 MG PO CAPS
10.0000 mg | ORAL_CAPSULE | Freq: Every day | ORAL | 1 refills | Status: DC
Start: 2021-09-06 — End: 2022-05-30

## 2021-09-06 MED ORDER — METOPROLOL SUCCINATE ER 50 MG PO TB24
ORAL_TABLET | ORAL | 1 refills | Status: DC
Start: 2021-09-06 — End: 2022-05-30

## 2021-09-25 ENCOUNTER — Ambulatory Visit: Payer: PPO | Admitting: Physician Assistant

## 2021-10-30 ENCOUNTER — Other Ambulatory Visit: Payer: Self-pay | Admitting: Internal Medicine

## 2021-10-30 DIAGNOSIS — I25118 Atherosclerotic heart disease of native coronary artery with other forms of angina pectoris: Secondary | ICD-10-CM

## 2021-10-30 DIAGNOSIS — E785 Hyperlipidemia, unspecified: Secondary | ICD-10-CM

## 2021-12-04 NOTE — Progress Notes (Signed)
Cardiology Office Note    Date:  12/06/2021   ID:  TAHSIN CARSE, DOB 1956/11/12, MRN [id number]  PCP:  Janith Lima, MD  Cardiologist:  Lauree Chandler, MD  Electrophysiologist:  None   Chief Complaint: f/u CAD  History of Present Illness:   Shane Garrett is a 66 y.o. male with history of CAD s/p 2V CABG in [place] 2012 (while visiting his daughter), HTN, HLD, DM who is seen for follow-up. He was previously followed by cardiology in [place]. He established care with Dr. Angelena Form in 09/2020. He had not had cath since his original bypass. He was admitted to [facility] 03/2019 with chest pain and had a normal nuclear stress test at that time, EF 53%. CXR did comment there was aortic tortuosity vs ectasia. He has done well since then.  He is seen back for follow-up doing well. No interim chest pain, SOB, palpitations or syncope. He is active playing pickleball regularly. He has been out of his metoprolol and ramipril for about a day due to supply issues at [facility]. He got word it will be refilled today. His daughter lives in [place].  Labwork independently reviewed: 06/2021 Hgb 14.1, plt 160, K 4.3, Cr 1.11, LDL 54, trig 96, TSH wnl, LFTs wnl  Cardiology Studies:   Studies reviewed are outlined and summarized above. Reports included below if pertinent.   NST 03/2019 OSH FINDINGS:  Perfusion: No significant decreased activity in the left ventricle  on stress imaging to suggest reversible ischemia or infarction.   Wall Motion: Normal left ventricular wall motion. No left  ventricular dilation.   Left Ventricular Ejection Fraction: 53 %   End diastolic volume 82 ml   End systolic volume 38 ml   IMPRESSION:  1. No significant reversible ischemia or infarction.   2. Normal left ventricular wall motion.   3. Left ventricular ejection fraction 53%   4. Non invasive risk stratification*: Low   2012 Appropriate Use Criteria for Coronary Revascularization  Focused Update: J Am  Coll Cardiol. [id number].  [email or web address]    Electronically Signed    By: Jerilynn Mages.  Shick M.D.    On: 04/15/2019 11:44    Past Medical History:  Diagnosis Date   Anxiety    CAD (coronary artery disease)    2V CABG in 2012   Diabetes mellitus without complication ([place])    Hyperlipidemia    Hypertension     Past Surgical History:  Procedure Laterality Date   CORONARY ARTERY BYPASS GRAFT     2V CABG in [place]    Current Medications: Current Meds  Medication Sig   allopurinol (ZYLOPRIM) 100 MG tablet Take 1 tablet by mouth once daily   aspirin EC 81 MG tablet Take by mouth daily.   citalopram (CELEXA) 40 MG tablet Take 1 tablet by mouth once daily   colchicine 0.6 MG tablet Take by mouth as needed.    ezetimibe (ZETIA) 10 MG tablet Take 1 tablet (10 mg total) by mouth daily.   fluticasone (FLONASE) 50 MCG/ACT nasal spray Place 2 sprays into both nostrils daily.   isosorbide mononitrate (IMDUR) 30 MG 24 hr tablet Take 1 tablet by mouth once daily   metFORMIN (GLUCOPHAGE-XR) 750 MG 24 hr tablet Take 2 tablets (1,500 mg total) by mouth daily with breakfast.   metoprolol succinate (TOPROL-XL) 50 MG 24 hr tablet TAKE 1 TABLET BY MOUTH ONCE DAILY TAKE  WITH  OR  IMMEDIATELY  FOLLOWING  A  MEAL.   nitroGLYCERIN (  NITROSTAT) 0.4 MG SL tablet Place under the tongue.   olopatadine (PATANOL) 0.1 % ophthalmic solution 1 drop as needed.   ramipril (ALTACE) 10 MG capsule Take 1 capsule (10 mg total) by mouth daily.   rosuvastatin (CRESTOR) 40 MG tablet Take 1 tablet by mouth once daily      Allergies:   Patient has no known allergies.   Social History   Socioeconomic History   Marital status: Married    Spouse name: Not on file   Number of children: 2   Years of education: Not on file   Highest education level: Not on file  Occupational History   Occupation: Retired-Sold building supplies  Tobacco Use   Smoking status: Never    Smokeless tobacco: Never  Vaping Use   Vaping Use: Never used  Substance and Sexual Activity   Alcohol use: Never   Drug use: Never   Sexual activity: Not on file  Other Topics Concern   Not on file  Social History Narrative   Not on file   Social Determinants of Health   Financial Resource Strain: Not on file  Food Insecurity: Not on file  Transportation Needs: Not on file  Physical Activity: Not on file  Stress: Not on file  Social Connections: Not on file     Family History:  The patient's family history includes Cancer in his mother; Diabetes in his brother; Heart attack (age of onset: 36) in his father.  ROS:   Please see the history of present illness.  All other systems are reviewed and otherwise negative.    EKG(s)/Additional Labs   EKG:  EKG is ordered today, personally reviewed, demonstrating NSR 68bpm, nonspecific STTW changes with mild ST depression/TW changes V2-V6 as well as I, avL, similar to prior  Recent Labs: 07/12/2021: ALT 37; BUN 15; Creatinine, Ser 1.11; Hemoglobin 14.1; Platelets 160.0; Potassium 4.3; Sodium 142; TSH 2.89  Recent Lipid Panel    Component Value Date/Time   CHOL 109 07/12/2021 1003   TRIG 96.0 07/12/2021 1003   HDL 35.80 (L) 07/12/2021 1003   CHOLHDL 3 07/12/2021 1003   VLDL 19.2 07/12/2021 1003   LDLCALC 54 07/12/2021 1003   LDLDIRECT 67.0 04/14/2020 1050    PHYSICAL EXAM:    VS:  BP 120/90    Pulse 67    Ht 6\' 1"  (1.854 m)    Wt 257 lb (116.6 kg)    SpO2 95%    BMI 33.91 kg/m   BMI: Body mass index is 33.91 kg/m.  GEN: Well nourished, well developed male in no acute distress HEENT: normocephalic, atraumatic Neck: no JVD, carotid bruits, or masses Cardiac: RRR; no murmurs, rubs, or gallops, no edema  Respiratory:  clear to auscultation bilaterally, normal work of breathing GI: soft, nontender, nondistended, + BS MS: no deformity or atrophy Skin: warm and dry, no rash Neuro:  Alert and Oriented x 3, Strength and  sensation are intact, follows commands Psych: euthymic mood, full affect  Wt Readings from Last 3 Encounters:  12/06/21 257 lb (116.6 kg)  07/12/21 252 lb (114.3 kg)  01/30/21 260 lb (117.9 kg)     ASSESSMENT & PLAN:   1. CAD - clinically doing well without angina. EKG is abnormal but consistent with prior tracing. Continue ASA 81mg  daily, metoprolol, rosuvastatin, ezetimibe.   2. Essential HTN - diastolic marginally elevated today but has been out of ramipril / Toprol for the last day or so. He is picking them up today. Systolic is  fine. Discussed periodically monitoring BP at home and notifying for low or elevated readings.  3. Hyperlipidemia - Lipids reviewed from 06/2021 and at goal. LFTs were normal. Continue rosuvastatin and ezetimibe.   4. Thoracic aortic ectasia - noted on CXR 03/2019. Given his chronically abnormal EKG and prior nuc with EF 53%, we will undertake a 2D echocardiogram to start to see if we can evaluate this finding further. If the aortic size is not clear by echocardiogram report, consideration can be given with CT but will start with echo as this will also allow [place] to have a better idea of his true baseline LV function.  Disposition: F/u with Dr. Angelena Form in 1 year.   Medication Adjustments/Labs and Tests Ordered: Current medicines are reviewed at length with the patient today.  Concerns regarding medicines are outlined above. Medication changes, Labs and Tests ordered today are summarized above and listed in the Patient Instructions accessible in Encounters.   Signed, Charlie Pitter, PA-C  12/06/2021 11:48 AM    [place] Phone: [phone]; Fax: [phone]

## 2021-12-06 ENCOUNTER — Other Ambulatory Visit: Payer: Self-pay

## 2021-12-06 ENCOUNTER — Encounter: Payer: Self-pay | Admitting: Physician Assistant

## 2021-12-06 ENCOUNTER — Ambulatory Visit: Payer: PPO | Admitting: Physician Assistant

## 2021-12-06 VITALS — BP 120/90 | HR 67 | Ht 73.0 in | Wt 257.0 lb

## 2021-12-06 DIAGNOSIS — I10 Essential (primary) hypertension: Secondary | ICD-10-CM

## 2021-12-06 DIAGNOSIS — I77810 Thoracic aortic ectasia: Secondary | ICD-10-CM

## 2021-12-06 DIAGNOSIS — E785 Hyperlipidemia, unspecified: Secondary | ICD-10-CM

## 2021-12-06 DIAGNOSIS — I2510 Atherosclerotic heart disease of native coronary artery without angina pectoris: Secondary | ICD-10-CM | POA: Diagnosis not present

## 2021-12-06 NOTE — Patient Instructions (Addendum)
Medication Instructions:  Your physician recommends that you continue on your current medications as directed. Please refer to the Current Medication list given to you today.  *If you need a refill on your cardiac medications before your next appointment, please call your pharmacy*   Lab Work: None ordered  If you have labs (blood work) drawn today and your tests are completely normal, you will receive your results only by: [place] (if you have MyChart) OR A paper copy in the mail If you have any lab test that is abnormal or we need to change your treatment, we will call you to review the results.   Testing/Procedures: Your physician has requested that you have an echocardiogram. Echocardiography is a painless test that uses sound waves to create images of your heart. It provides your doctor with information about the size and shape of your heart and how well your hearts chambers and valves are working. This procedure takes approximately one hour. There are no restrictions for this procedure.    Follow-Up: At [facility], you and your health needs are our priority.  As part of our continuing mission to provide you with exceptional heart care, we have created designated Provider Care Teams.  These Care Teams include your primary Cardiologist (physician) and Advanced Practice Providers (APPs -  Physician Assistants and Nurse Practitioners) who all work together to provide you with the care you need, when you need it.  We recommend signing up for the patient portal called "MyChart".  Sign up information is provided on this After Visit Summary.  MyChart is used to connect with patients for Virtual Visits (Telemedicine).  Patients are able to view lab/test results, encounter notes, upcoming appointments, etc.  Non-urgent messages can be sent to your provider as well.   To learn more about what you can do with MyChart, go to [email or web address].    Your next appointment:   12  month(s)  The format for your next appointment:   In Person  Provider:   Lauree Chandler, MD  or Melina Copa, PA-C         Other Instructions  Please monitor your blood pressure occasionally at home. Call your doctor if you tend to get readings of greater than 130 on the top number or 80 on the bottom number.

## 2021-12-18 ENCOUNTER — Ambulatory Visit (HOSPITAL_COMMUNITY): Payer: PPO | Attending: Internal Medicine

## 2021-12-18 ENCOUNTER — Other Ambulatory Visit: Payer: Self-pay

## 2021-12-18 DIAGNOSIS — E785 Hyperlipidemia, unspecified: Secondary | ICD-10-CM | POA: Diagnosis present

## 2021-12-18 DIAGNOSIS — I77810 Thoracic aortic ectasia: Secondary | ICD-10-CM | POA: Insufficient documentation

## 2021-12-18 DIAGNOSIS — I10 Essential (primary) hypertension: Secondary | ICD-10-CM | POA: Diagnosis present

## 2021-12-18 DIAGNOSIS — I2510 Atherosclerotic heart disease of native coronary artery without angina pectoris: Secondary | ICD-10-CM | POA: Diagnosis present

## 2021-12-18 LAB — ECHOCARDIOGRAM COMPLETE
Area-P 1/2: 3.21 cm2
S' Lateral: 3.1 cm

## 2021-12-19 ENCOUNTER — Telehealth: Payer: Self-pay | Admitting: *Deleted

## 2021-12-19 DIAGNOSIS — I77819 Aortic ectasia, unspecified site: Secondary | ICD-10-CM

## 2021-12-19 NOTE — Telephone Encounter (Signed)
-----   Message from Laurann [place], [place] sent at 12/19/2021  7:38 AM EST ----- Please inform patient that echo shows normal heart pumping function (better than reported on prior nuc stress test). 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 This was also previously described on a CXR. Anticipate this will be followed on a yearly basis. Since no other CT to compare to, would suggest getting a gated CT of aorta to clarify the size, will need BMET beforehand.

## 2021-12-20 ENCOUNTER — Other Ambulatory Visit: Payer: Self-pay | Admitting: Internal Medicine

## 2021-12-20 DIAGNOSIS — F325 Major depressive disorder, single episode, in full remission: Secondary | ICD-10-CM

## 2021-12-20 DIAGNOSIS — M1A0790 Idiopathic chronic gout, unspecified ankle and foot, without tophus (tophi): Secondary | ICD-10-CM

## 2021-12-21 ENCOUNTER — Other Ambulatory Visit: Payer: Self-pay | Admitting: Internal Medicine

## 2021-12-21 ENCOUNTER — Telehealth: Payer: Self-pay

## 2021-12-21 DIAGNOSIS — M1A0790 Idiopathic chronic gout, unspecified ankle and foot, without tophus (tophi): Secondary | ICD-10-CM

## 2021-12-21 DIAGNOSIS — F325 Major depressive disorder, single episode, in full remission: Secondary | ICD-10-CM

## 2021-12-21 DIAGNOSIS — E119 Type 2 diabetes mellitus without complications: Secondary | ICD-10-CM

## 2021-12-21 MED ORDER — METFORMIN HCL ER 750 MG PO TB24: 1500.0000 mg | ORAL_TABLET | Freq: Every day | ORAL | 0 refills | Status: DC

## 2021-12-21 MED ORDER — ALLOPURINOL 100 MG PO TABS: 100.0000 mg | ORAL_TABLET | Freq: Every day | ORAL | 0 refills | Status: DC

## 2021-12-21 MED ORDER — CITALOPRAM HYDROBROMIDE 40 MG PO TABS: 40.0000 mg | ORAL_TABLET | Freq: Every day | ORAL | 0 refills | Status: DC

## 2021-12-21 NOTE — Telephone Encounter (Signed)
Pt calling in to request a medication refill on: citalopram (CELEXA) 40 MG tablet allopurinol (ZYLOPRIM) 100 MG tablet  Pt scheduled a F/U appt for 3/16 first Thursday available.   Pharmacy: [facility] [address] [place], [place] - [address]  LOV: 07/12/21  CB [phone]

## 2021-12-22 NOTE — Telephone Encounter (Signed)
Patient's wife is following up. She reviewed his echo results and she would like to know why a CT has been ordered. Please advise.

## 2021-12-22 NOTE — Telephone Encounter (Signed)
Returned the call to pt's wife, DPR on file.  No answer / no voicemail.

## 2021-12-25 ENCOUNTER — Other Ambulatory Visit: Payer: Self-pay

## 2021-12-25 ENCOUNTER — Other Ambulatory Visit: Payer: PPO | Admitting: *Deleted

## 2021-12-25 DIAGNOSIS — I77819 Aortic ectasia, unspecified site: Secondary | ICD-10-CM

## 2021-12-25 NOTE — Telephone Encounter (Signed)
Returned the call.  No answer no voicemail.

## 2021-12-26 LAB — BASIC METABOLIC PANEL
BUN/Creatinine Ratio: 14 (ref 10–24)
BUN: 15 mg/dL (ref 8–27)
CO2: 25 mmol/L (ref 20–29)
Calcium: 9.6 mg/dL (ref 8.6–10.2)
Chloride: 105 mmol/L (ref 96–106)
Creatinine, Ser: 1.09 mg/dL (ref 0.76–1.27)
Glucose: 130 mg/dL — ABNORMAL HIGH (ref 70–99)
Potassium: 3.9 mmol/L (ref 3.5–5.2)
Sodium: 141 mmol/L (ref 134–144)
eGFR: 74 mL/min/{1.73_m2} (ref >59)

## 2022-01-01 ENCOUNTER — Other Ambulatory Visit: Payer: Self-pay

## 2022-01-01 ENCOUNTER — Ambulatory Visit (HOSPITAL_COMMUNITY)
Admission: RE | Admit: 2022-01-01 | Discharge: 2022-01-01 | Disposition: A | Discharge status: Home / Self Care | Payer: PPO | Source: Ambulatory Visit | Attending: Physician Assistant | Admitting: Physician Assistant

## 2022-01-01 DIAGNOSIS — I77819 Aortic ectasia, unspecified site: Secondary | ICD-10-CM | POA: Insufficient documentation

## 2022-01-01 MED ORDER — IOHEXOL 350 MG/ML SOLN
75.0000 mL | Freq: Once | INTRAVENOUS | Status: AC | PRN
  Administered 2022-01-01: 75 mL via INTRAVENOUS

## 2022-01-08 ENCOUNTER — Other Ambulatory Visit: Payer: Self-pay | Admitting: Internal Medicine

## 2022-01-08 DIAGNOSIS — I25118 Atherosclerotic heart disease of native coronary artery with other forms of angina pectoris: Secondary | ICD-10-CM

## 2022-01-08 DIAGNOSIS — E785 Hyperlipidemia, unspecified: Secondary | ICD-10-CM

## 2022-02-05 ENCOUNTER — Other Ambulatory Visit: Payer: Self-pay | Admitting: Internal Medicine

## 2022-02-05 DIAGNOSIS — I25118 Atherosclerotic heart disease of native coronary artery with other forms of angina pectoris: Secondary | ICD-10-CM

## 2022-02-05 DIAGNOSIS — E785 Hyperlipidemia, unspecified: Secondary | ICD-10-CM

## 2022-02-08 ENCOUNTER — Ambulatory Visit (INDEPENDENT_AMBULATORY_CARE_PROVIDER_SITE_OTHER): Payer: PPO | Admitting: Internal Medicine

## 2022-02-08 ENCOUNTER — Encounter: Payer: Self-pay | Admitting: Internal Medicine

## 2022-02-08 ENCOUNTER — Other Ambulatory Visit: Payer: Self-pay

## 2022-02-08 VITALS — BP 126/82 | HR 64 | Temp 98.5°F | Ht 72.0 in | Wt 257.0 lb

## 2022-02-08 DIAGNOSIS — E041 Nontoxic single thyroid nodule: Secondary | ICD-10-CM | POA: Insufficient documentation

## 2022-02-08 DIAGNOSIS — Z125 Encounter for screening for malignant neoplasm of prostate: Secondary | ICD-10-CM | POA: Insufficient documentation

## 2022-02-08 DIAGNOSIS — I25118 Atherosclerotic heart disease of native coronary artery with other forms of angina pectoris: Secondary | ICD-10-CM | POA: Diagnosis not present

## 2022-02-08 DIAGNOSIS — I10 Essential (primary) hypertension: Secondary | ICD-10-CM

## 2022-02-08 DIAGNOSIS — E119 Type 2 diabetes mellitus without complications: Secondary | ICD-10-CM

## 2022-02-08 LAB — CBC WITH DIFFERENTIAL/PLATELET
Basophils Absolute: 0.1 10*3/uL (ref 0.0–0.1)
Basophils Relative: 1 % (ref 0.0–3.0)
Eosinophils Absolute: 0.6 10*3/uL (ref 0.0–0.7)
Eosinophils Relative: 10.7 % — ABNORMAL HIGH (ref 0.0–5.0)
HCT: 40.6 % (ref 39.0–52.0)
Hemoglobin: 13.7 g/dL (ref 13.0–17.0)
Lymphocytes Relative: 25.8 % (ref 12.0–46.0)
Lymphs Abs: 1.5 10*3/uL (ref 0.7–4.0)
MCHC: 33.7 g/dL (ref 30.0–36.0)
MCV: 89.3 fl (ref 78.0–100.0)
Monocytes Absolute: 0.4 10*3/uL (ref 0.1–1.0)
Monocytes Relative: 7 % (ref 3.0–12.0)
Neutro Abs: 3.2 10*3/uL (ref 1.4–7.7)
Neutrophils Relative %: 55.5 % (ref 43.0–77.0)
Platelets: 167 10*3/uL (ref 150.0–400.0)
RBC: 4.55 Mil/uL (ref 4.22–5.81)
RDW: 14.8 % (ref 11.5–15.5)
WBC: 5.7 10*3/uL (ref 4.0–10.5)

## 2022-02-08 LAB — BASIC METABOLIC PANEL
BUN: 17 mg/dL (ref 6–23)
CO2: 27 mEq/L (ref 19–32)
Calcium: 9.7 mg/dL (ref 8.4–10.5)
Chloride: 107 mEq/L (ref 96–112)
Creatinine, Ser: 1.19 mg/dL (ref 0.40–1.50)
GFR: 62.99 mL/min (ref >60.00)
Glucose, Bld: 138 mg/dL — ABNORMAL HIGH (ref 70–99)
Potassium: 4.2 mEq/L (ref 3.5–5.1)
Sodium: 140 mEq/L (ref 135–145)

## 2022-02-08 LAB — PSA: PSA: 2.68 ng/mL (ref 0.10–4.00)

## 2022-02-08 LAB — HEMOGLOBIN A1C: Hgb A1c MFr Bld: 7.8 % — ABNORMAL HIGH (ref 4.6–6.5)

## 2022-02-08 LAB — TSH: TSH: 3.33 u[IU]/mL (ref 0.35–5.50)

## 2022-02-08 MED ORDER — RYBELSUS 3 MG PO TABS
3.0000 mg | ORAL_TABLET | Freq: Every day | ORAL | 0 refills | Status: DC
Start: 2022-02-08 — End: 2022-03-13

## 2022-02-08 NOTE — Patient Instructions (Signed)
Type 2 Diabetes Mellitus, Diagnosis, Adult ?Type 2 diabetes (type 2 diabetes mellitus) is a long-term, or chronic, disease. In type 2 diabetes, one or both of these problems may be present: ?The pancreas does not make enough of a hormone called insulin. ?Cells in the body do not respond properly to the insulin that the body makes (insulin resistance). ?Normally, insulin allows blood sugar (glucose) to enter cells in the body. The cells use glucose for energy. Insulin resistance or lack of insulin causes excess glucose to build up in the blood instead of going into cells. This causes high blood glucose (hyperglycemia).  ?What are the causes? ?The exact cause of type 2 diabetes is not known. ?What increases the risk? ?The following factors may make you more likely to develop this condition: ?Having a family member with type 2 diabetes. ?Being overweight or obese. ?Being inactive (sedentary). ?Having been diagnosed with insulin resistance. ?Having a history of prediabetes, diabetes when you were pregnant (gestational diabetes), or polycystic ovary syndrome (PCOS). ?What are the signs or symptoms? ?In the early stage of this condition, you may not have symptoms. Symptoms develop slowly and may include: ?Increased thirst or hunger. ?Increased urination. ?Unexplained weight loss. ?Tiredness (fatigue) or weakness. ?Vision changes, such as blurry vision. ?Dark patches on the skin. ?How is this diagnosed? ?This condition is diagnosed based on your symptoms, your medical history, a physical exam, and your blood glucose level. Your blood glucose may be checked with one or more of the following blood tests: ?A fasting blood glucose (FBG) test. You will not be allowed to eat (you will fast) for 8 hours or longer before a blood sample is taken. ?A random blood glucose test. This test checks blood glucose at any time of day regardless of when you ate. ?An A1C (hemoglobin A1C) blood test. This test provides information about blood  glucose levels over the previous 2-3 months. ?An oral glucose tolerance test (OGTT). This test measures your blood glucose at two times: ?After fasting. This is your baseline blood glucose level. ?Two hours after drinking a beverage that contains glucose. ?You may be diagnosed with type 2 diabetes if: ?Your fasting blood glucose level is 126 mg/dL (7.0 mmol/L) or higher. ?Your random blood glucose level is 200 mg/dL (11.1 mmol/L) or higher. ?Your A1C level is 6.5% or higher. ?Your oral glucose tolerance test result is higher than 200 mg/dL (11.1 mmol/L). ?These blood tests may be repeated to confirm your diagnosis. ?How is this treated? ?Your treatment may be managed by a specialist called an endocrinologist. Type 2 diabetes may be treated by following instructions from your health care provider about: ?Making dietary and lifestyle changes. These may include: ?Following a personalized nutrition plan that is developed by a registered dietitian. ?Exercising regularly. ?Finding ways to manage stress. ?Checking your blood glucose level as often as told. ?Taking diabetes medicines or insulin daily. This helps to keep your blood glucose levels in the healthy range. ?Taking medicines to help prevent complications from diabetes. Medicines may include: ?Aspirin. ?Medicine to lower cholesterol. ?Medicine to control blood pressure. ?Your health care provider will set treatment goals for you. Your goals will be based on your age, other medical conditions you have, and how you respond to diabetes treatment. Generally, the goal of treatment is to maintain the following blood glucose levels: ?Before meals: 80-130 mg/dL (4.4-7.2 mmol/L). ?After meals: below 180 mg/dL (10 mmol/L). ?A1C level: less than 7%. ?Follow these instructions at home: ?Questions to ask your health care provider ?  Consider asking the following questions: ?Should I meet with a certified diabetes care and education specialist? ?What diabetes medicines do I need,  and when should I take them? ?What equipment will I need to manage my diabetes at home? ?How often do I need to check my blood glucose? ?Where can I find a support group for people with diabetes? ?What number can I call if I have questions? ?When is my next appointment? ?General instructions ?Take over-the-counter and prescription medicines only as told by your health care provider. ?Keep all follow-up visits. This is important. ?Where to find more information ?For help and guidance and for more information about diabetes, please visit: ?American Diabetes Association (ADA): www.diabetes.org ?American Association of Diabetes Care and Education Specialists (ADCES): www.diabeteseducator.org ?International Diabetes Federation (IDF): www.idf.org ?Contact a health care provider if: ?Your blood glucose is at or above 240 mg/dL (13.3 mmol/L) for 2 days in a row. ?You have been sick or have had a fever for 2 days or longer, and you are not getting better. ?You have any of the following problems for more than 6 hours: ?You cannot eat or drink. ?You have nausea and vomiting. ?You have diarrhea. ?Get help right away if: ?You have severe hypoglycemia. This means your blood glucose is lower than 54 mg/dL (3.0 mmol/L). ?You become confused or you have trouble thinking clearly. ?You have difficulty breathing. ?You have moderate or large ketone levels in your urine. ?These symptoms may represent a serious problem that is an emergency. Do not wait to see if the symptoms will go away. Get medical help right away. Call your local emergency services (911 in the [place] Do not drive yourself to the hospital. ?Summary ?Type 2 diabetes mellitus is a long-term, or chronic, disease. In type 2 diabetes, the pancreas does not make enough of a hormone called insulin, or cells in the body do not respond properly to insulin that the body makes. ?This condition is treated by making dietary and lifestyle changes and taking diabetes medicines or  insulin. ?Your health care provider will set treatment goals for you. Your goals will be based on your age, other medical conditions you have, and how you respond to diabetes treatment. ?Keep all follow-up visits. This is important. ?This information is not intended to replace advice given to you by your health care provider. Make sure you discuss any questions you have with your health care provider. ?Document Revised: 02/06/2021 Document Reviewed: 02/06/2021 ?Elsevier Patient Education ? 2022 Elsevier Inc. ? ?

## 2022-02-08 NOTE — Progress Notes (Signed)
? ?Subjective:  ?Patient ID: Shane Garrett, male    DOB: July 29, 1956  Age: 66 y.o. MRN: [id number] ? ?CC: Hypertension and Diabetes ? ?This visit occurred during the SARS-CoV-2 public health emergency.  Safety protocols were in place, including screening questions prior to the visit, additional usage of staff PPE, and extensive cleaning of exam room while observing appropriate contact time as indicated for disinfecting solutions.   ? ?HPI ?GLENDEL JAGGERS presents for f/up -  ? ?He denies polys.  He does not monitor his blood sugar.  He is active and denies chest pain, shortness of breath, diaphoresis, dizziness, lightheadedness, or edema.  He was not willing to get a pneumonia vaccine today. ? ?Outpatient Medications Prior to Visit  ?Medication Sig Dispense Refill  ? allopurinol (ZYLOPRIM) 100 MG tablet Take 1 tablet (100 mg total) by mouth daily. 90 tablet 0  ? aspirin EC 81 MG tablet Take by mouth daily.    ? citalopram (CELEXA) 40 MG tablet Take 1 tablet (40 mg total) by mouth daily. 90 tablet 0  ? colchicine 0.6 MG tablet Take by mouth as needed.     ? ezetimibe (ZETIA) 10 MG tablet Take 1 tablet (10 mg total) by mouth daily. 90 tablet 0  ? fluticasone (FLONASE) 50 MCG/ACT nasal spray Place 2 sprays into both nostrils daily. 16 g 0  ? isosorbide mononitrate (IMDUR) 30 MG 24 hr tablet Take 1 tablet by mouth once daily 30 tablet 1  ? metFORMIN (GLUCOPHAGE-XR) 750 MG 24 hr tablet Take 2 tablets (1,500 mg total) by mouth daily with breakfast. 180 tablet 0  ? metoprolol succinate (TOPROL-XL) 50 MG 24 hr tablet TAKE 1 TABLET BY MOUTH ONCE DAILY TAKE  WITH  OR  IMMEDIATELY  FOLLOWING  A  MEAL. 90 tablet 1  ? nitroGLYCERIN (NITROSTAT) 0.4 MG SL tablet Place under the tongue.    ? olopatadine (PATANOL) 0.1 % ophthalmic solution 1 drop as needed.    ? ramipril (ALTACE) 10 MG capsule Take 1 capsule (10 mg total) by mouth daily. 90 capsule 1  ? rosuvastatin (CRESTOR) 40 MG tablet Take 1 tablet by mouth once daily 90 tablet 0   ? ?No facility-administered medications prior to visit.  ? ? ?ROS ?Review of Systems  ?Constitutional:  Positive for unexpected weight change (wt gain). Negative for chills, diaphoresis and fatigue.  ?HENT: Negative.    ?Eyes: Negative.   ?Respiratory:  Negative for cough, chest tightness and wheezing.   ?Cardiovascular:  Negative for chest pain, palpitations and leg swelling.  ?Gastrointestinal:  Negative for abdominal pain, constipation, diarrhea, nausea and vomiting.  ?Endocrine: Negative.   ?Genitourinary: Negative.  Negative for difficulty urinating.  ?Musculoskeletal: Negative.  Negative for arthralgias and myalgias.  ?Skin: Negative.   ?Neurological:  Negative for dizziness, weakness and headaches.  ?Hematological:  Negative for adenopathy. Does not bruise/bleed easily.  ?Psychiatric/Behavioral: Negative.    ? ?Objective:  ?BP 126/82   Pulse 64   Temp 98.5 ?F (36.9 ?C) (Oral)   Ht 6' (1.829 m)   Wt 257 lb (116.6 kg)   HC 1" (2.5 cm)   SpO2 92%   BMI 34.86 kg/m?  ? ?BP Readings from Last 3 Encounters:  ?02/08/22 126/82  ?12/06/21 120/90  ?07/12/21 118/80  ? ? ?Wt Readings from Last 3 Encounters:  ?02/08/22 257 lb (116.6 kg)  ?12/06/21 257 lb (116.6 kg)  ?07/12/21 252 lb (114.3 kg)  ? ? ?Physical Exam ?Vitals reviewed.  ?HENT:  ?  Mouth/Throat:  ?   Mouth: Mucous membranes are moist.  ?Eyes:  ?   General: No scleral icterus. ?   Conjunctiva/sclera: Conjunctivae normal.  ?Neck:  ?   Thyroid: No thyroid mass, thyromegaly or thyroid tenderness.  ?Cardiovascular:  ?   Rate and Rhythm: Normal rate and regular rhythm.  ?   Heart sounds: No murmur heard. ?Pulmonary:  ?   Effort: Pulmonary effort is normal.  ?   Breath sounds: No stridor. No wheezing, rhonchi or rales.  ?Abdominal:  ?   General: Abdomen is flat.  ?   Palpations: There is no mass.  ?   Tenderness: There is no abdominal tenderness. There is no guarding.  ?   Hernia: No hernia is present.  ?Musculoskeletal:     ?   General: Normal range of  motion.  ?   Cervical back: Neck supple.  ?   Right lower leg: No edema.  ?   Left lower leg: No edema.  ?Lymphadenopathy:  ?   Cervical: No cervical adenopathy.  ?Skin: ?   General: Skin is dry.  ?Neurological:  ?   General: No focal deficit present.  ?   Mental Status: He is alert.  ?Psychiatric:     ?   Mood and Affect: Mood normal.     ?   Behavior: Behavior normal.  ? ? ?Lab Results  ?Component Value Date  ? WBC 5.7 02/08/2022  ? HGB 13.7 02/08/2022  ? HCT 40.6 02/08/2022  ? PLT 167.0 02/08/2022  ? GLUCOSE 138 (H) 02/08/2022  ? CHOL 109 07/12/2021  ? TRIG 96.0 07/12/2021  ? HDL 35.80 (L) 07/12/2021  ? LDLDIRECT 67.0 04/14/2020  ? LDLCALC 54 07/12/2021  ? ALT 37 07/12/2021  ? AST 36 07/12/2021  ? NA 140 02/08/2022  ? K 4.2 02/08/2022  ? CL 107 02/08/2022  ? CREATININE 1.19 02/08/2022  ? BUN 17 02/08/2022  ? CO2 27 02/08/2022  ? TSH 3.33 02/08/2022  ? PSA 2.68 02/08/2022  ? HGBA1C 7.8 (H) 02/08/2022  ? MICROALBUR <0.7 07/12/2021  ? ? ?CT ANGIO CHEST AORTA W/ & OR WO/CM & GATING ([facility] ONLY) ? ?Result Date: 01/01/2022 ?CLINICAL DATA:  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 EXAM: CT ANGIOGRAPHY CHEST WITH CONTRAST TECHNIQUE: Multidetector CT imaging of the chest was performed using the standard protocol during bolus administration of intravenous contrast. Multiplanar CT image reconstructions and MIPs were obtained to evaluate the vascular anatomy. RADIATION DOSE REDUCTION: This exam was performed according to the departmental dose-optimization program which includes automated exposure control, adjustment of the mA and/or kV according to patient size and/or use of iterative reconstruction technique. CONTRAST:  58mL OMNIPAQUE IOHEXOL 350 MG/ML SOLN COMPARISON:  Report from an echocardiogram dated 12/18/2021 FINDINGS: Cardiovascular: Caliber of the aortic root is top-normal measuring 4 cm at the level of the sinuses of Valsalva. The ascending thoracic aorta is top-normal in  caliber and not overtly aneurysmal measuring 3.9 cm in greatest estimated diameter. The thoracic aorta is moderately tortuous. The proximal arch measures 3.4 cm and the distal arch 2.8 cm. The descending thoracic aorta measures 2.5 cm. No evidence of aortic dissection. Proximal great vessels demonstrate normal patency and normal branching anatomy. There is evidence of prior CABG. The heart size is normal. No pericardial fluid identified. Central pulmonary arteries are normal in caliber. Mediastinum/Nodes: No enlarged mediastinal, hilar, or axillary lymph nodes. The trachea, and esophagus demonstrate no significant findings. Low-density mid right thyroid nodule  measures approximately 14 mm in greatest diameter. Lungs/Pleura: There is no evidence of pulmonary edema, consolidation, pneumothorax, nodule or pleural fluid. Upper Abdomen: No acute abnormality. Musculoskeletal: No chest wall abnormality. No acute or significant osseous findings. Review of the MIP images confirms the above findings. IMPRESSION: 1. No overt aneurysmal dilatation of the thoracic aorta. The aortic root and ascending thoracic aorta are top-normal in caliber with the root measuring 4 cm and the ascending thoracic aorta at 3.9 cm. The thoracic aorta is also moderately tortuous. 2. 1.4 cm right thyroid nodule. 1.4 cm incidental right thyroid nodule. No follow-up imaging is recommended. Reference: J Am Coll Radiol. 2015 Feb;12(2): 143-50 Electronically Signed   By: Irish LackGlenn  Yamagata M.D.   On: 01/01/2022 09:33  ? ? ?Assessment & Plan:  ? ?Greggory StallionGeorge was seen today for hypertension and diabetes. ? ?Diagnoses and all orders for this visit: ? ?Primary hypertension- His blood pressure is adequately well controlled. ?-     CBC with Differential/Platelet; Future ?-     Basic metabolic panel; Future ?-     Basic metabolic panel ?-     CBC with Differential/Platelet ? ?Type 2 diabetes mellitus without complication, without long-term current use of insulin (HCC)-  His A1c is up to 7.8%.  I recommend that he add a GLP-1 agonist to his current regimen. ?-     Basic metabolic panel; Future ?-     Hemoglobin A1c; Future ?-     Hemoglobin A1c ?-     Basic metabolic panel ?-

## 2022-02-12 ENCOUNTER — Other Ambulatory Visit: Payer: Self-pay | Admitting: Family Medicine

## 2022-02-19 ENCOUNTER — Telehealth: Payer: Self-pay | Admitting: Internal Medicine

## 2022-02-19 ENCOUNTER — Other Ambulatory Visit: Payer: Self-pay | Admitting: Internal Medicine

## 2022-02-19 DIAGNOSIS — F411 Generalized anxiety disorder: Secondary | ICD-10-CM

## 2022-02-19 MED ORDER — CLONAZEPAM 0.5 MG PO TABS: 0.5000 mg | ORAL_TABLET | Freq: Every day | ORAL | 1 refills | Status: DC | PRN

## 2022-02-19 NOTE — Telephone Encounter (Signed)
Pt requesting a new rx for clonazepam 0.5 mg ? ?Advised pt medication was discontinued by another provider ? ?Pt requesting a cb to discuss request ?

## 2022-03-01 ENCOUNTER — Telehealth: Payer: Self-pay | Admitting: Internal Medicine

## 2022-03-01 NOTE — Telephone Encounter (Signed)
Left message for patient to call back to schedule Medicare Annual Wellness Visit   No hx of AWV eligible as of 01/25/20  Please schedule at anytime with [facility] Advisor if patient calls the office back.     Any questions, please call me at [phone] 

## 2022-03-13 ENCOUNTER — Telehealth: Payer: Self-pay | Admitting: Internal Medicine

## 2022-03-13 ENCOUNTER — Other Ambulatory Visit: Payer: Self-pay | Admitting: Internal Medicine

## 2022-03-13 DIAGNOSIS — E119 Type 2 diabetes mellitus without complications: Secondary | ICD-10-CM

## 2022-03-13 MED ORDER — RYBELSUS 7 MG PO TABS
7.0000 mg | ORAL_TABLET | Freq: Every day | ORAL | 1 refills | Status: DC
Start: 2022-03-13 — End: 2022-07-19

## 2022-03-13 NOTE — Telephone Encounter (Signed)
Pt stated provider prescribed rybelsus for 30 days ? ?Pt inquiring what provider wants him to do next ? ?Pt requesting a cb w/ provider's response ?

## 2022-03-13 NOTE — Telephone Encounter (Signed)
Pt has been informed to increase Rybelsus to 7mg  and expressed understanding.  ? ?He scheduled his f/u for 8/24 @ 8.40am ?

## 2022-03-21 ENCOUNTER — Other Ambulatory Visit: Payer: Self-pay | Admitting: Internal Medicine

## 2022-03-21 DIAGNOSIS — M1A0790 Idiopathic chronic gout, unspecified ankle and foot, without tophus (tophi): Secondary | ICD-10-CM

## 2022-03-21 DIAGNOSIS — F325 Major depressive disorder, single episode, in full remission: Secondary | ICD-10-CM

## 2022-04-10 LAB — HM DIABETES EYE EXAM

## 2022-04-13 ENCOUNTER — Other Ambulatory Visit: Payer: Self-pay | Admitting: Internal Medicine

## 2022-04-13 DIAGNOSIS — E119 Type 2 diabetes mellitus without complications: Secondary | ICD-10-CM

## 2022-05-10 IMAGING — CT CTA CHEST W/ AND/OR W/O CM W/ OR W/O DISSECTION AND GATING
1 of 7 series · 3 of 16 positions shown, 4 images · IV contrast (APPLIED)
Comparison: Report from an echocardiogram dated 12/18/2021

CLINICAL DATA: Dilated ascending thoracic aorta by echocardiography
and history of prior CABG.

EXAM:
CT ANGIOGRAPHY CHEST WITH CONTRAST
TECHNIQUE: Multidetector CT imaging of the chest was performed using the
standard protocol during bolus administration of intravenous
contrast. Multiplanar CT image reconstructions and MIPs were
obtained to evaluate the vascular anatomy.

[Series 7: arterial thins · axial · arterial · 0.90mm/px · z∈[-187,-28]mm · 3 of 531 slices shown, 4 images]
[im 133/531  soft-tissue]
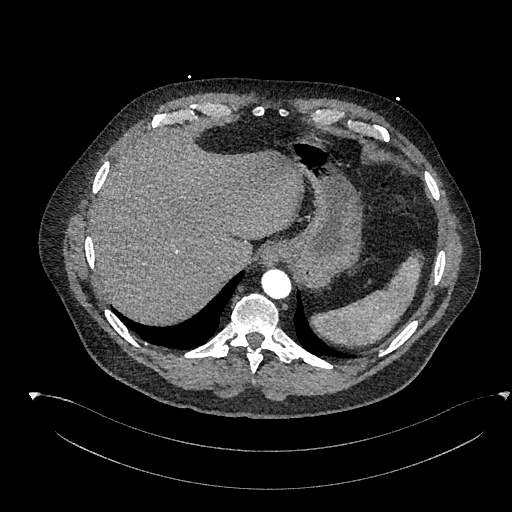
[im 133/531  bone]
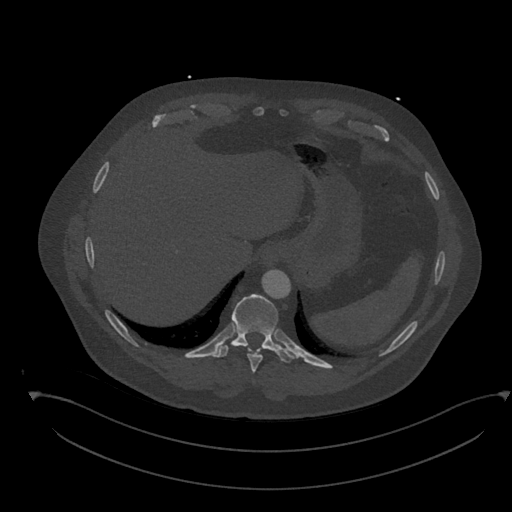
[im 266/531  soft-tissue]
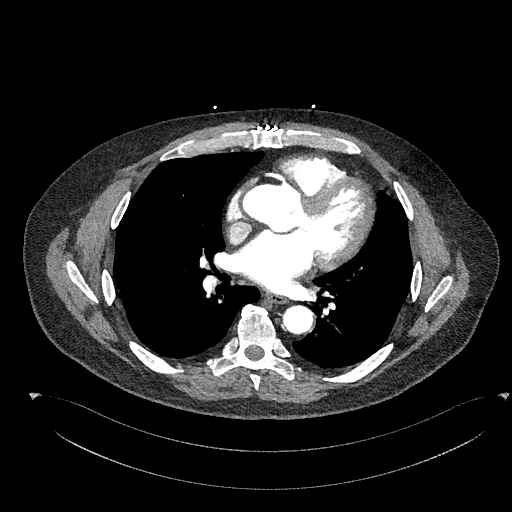
[im 398/531  soft-tissue]
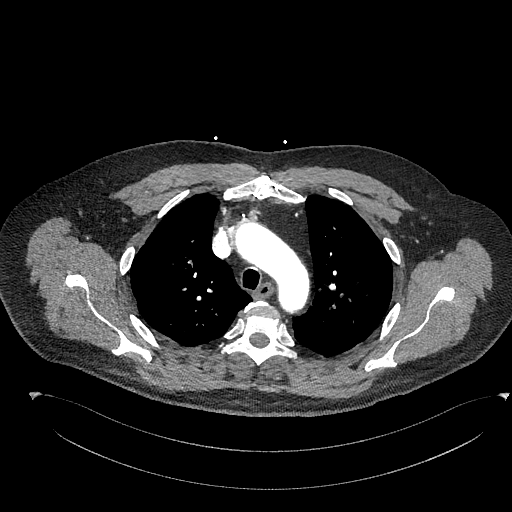

[3 of 16 positions shown; findings below may reference images not displayed]

RADIATION DOSE REDUCTION: This exam was performed according to the
departmental dose-optimization program which includes automated
exposure control, adjustment of the mA and/or kV according to
patient size and/or use of iterative reconstruction technique.

CONTRAST:  75mL OMNIPAQUE IOHEXOL 350 MG/ML SOLN
FINDINGS: Cardiovascular: Caliber of the aortic root is top-normal measuring 4
cm at the level of the sinuses of Valsalva. The ascending thoracic
aorta is top-normal in caliber and not overtly aneurysmal measuring
3.9 cm in greatest estimated diameter. The thoracic aorta is
moderately tortuous. The proximal arch measures 3.4 cm and the
distal arch 2.8 cm. The descending thoracic aorta measures 2.5 cm.
No evidence of aortic dissection. Proximal great vessels demonstrate
normal patency and normal branching anatomy. There is evidence of
prior CABG.

The heart size is normal. No pericardial fluid identified. Central
pulmonary arteries are normal in caliber.

Mediastinum/Nodes: No enlarged mediastinal, hilar, or axillary lymph
nodes. The trachea, and esophagus demonstrate no significant
findings. Low-density mid right thyroid nodule measures
approximately 14 mm in greatest diameter.

Lungs/Pleura: There is no evidence of pulmonary edema,
consolidation, pneumothorax, nodule or pleural fluid.

Upper Abdomen: No acute abnormality.

Musculoskeletal: No chest wall abnormality. No acute or significant
osseous findings.

Review of the MIP images confirms the above findings.
IMPRESSION: 1. No overt aneurysmal dilatation of the thoracic aorta. The aortic
root and ascending thoracic aorta are top-normal in caliber with the
root measuring 4 cm and the ascending thoracic aorta at 3.9 cm. The
thoracic aorta is also moderately tortuous.
2. 1.4 cm right thyroid nodule.
1.4 cm incidental right thyroid nodule. No follow-up imaging is
recommended.
Reference: [HOSPITAL]. [DATE]): 143-50

## 2022-05-11 ENCOUNTER — Other Ambulatory Visit: Payer: Self-pay | Admitting: Internal Medicine

## 2022-05-11 DIAGNOSIS — E785 Hyperlipidemia, unspecified: Secondary | ICD-10-CM

## 2022-05-11 DIAGNOSIS — I25118 Atherosclerotic heart disease of native coronary artery with other forms of angina pectoris: Secondary | ICD-10-CM

## 2022-05-14 ENCOUNTER — Other Ambulatory Visit: Payer: Self-pay | Admitting: Internal Medicine

## 2022-05-14 DIAGNOSIS — E785 Hyperlipidemia, unspecified: Secondary | ICD-10-CM

## 2022-05-14 DIAGNOSIS — I25118 Atherosclerotic heart disease of native coronary artery with other forms of angina pectoris: Secondary | ICD-10-CM

## 2022-05-30 ENCOUNTER — Other Ambulatory Visit: Payer: Self-pay | Admitting: Internal Medicine

## 2022-05-30 DIAGNOSIS — I10 Essential (primary) hypertension: Secondary | ICD-10-CM

## 2022-06-04 ENCOUNTER — Telehealth: Payer: Self-pay | Admitting: Internal Medicine

## 2022-06-04 NOTE — Telephone Encounter (Signed)
Left message for patient to call back to schedule Medicare Annual Wellness Visit   No hx of AWV eligible as of 01/25/20  Please schedule at anytime with [facility] Advisor if patient calls the office back.    45 Minutes appointment   Any questions, please call me at [phone]

## 2022-06-08 ENCOUNTER — Ambulatory Visit (INDEPENDENT_AMBULATORY_CARE_PROVIDER_SITE_OTHER): Payer: PPO | Admitting: *Deleted

## 2022-06-08 VITALS — Ht 73.0 in | Wt 245.0 lb

## 2022-06-08 DIAGNOSIS — Z0000 Encounter for general adult medical examination without abnormal findings: Secondary | ICD-10-CM

## 2022-06-08 NOTE — Progress Notes (Signed)
Subjective:   Shane Garrett is a 66 y.o. male who presents for Medicare Annual/Subsequent preventive examination.  I connected with  Shane Garrett on 06/08/22 by a audio enabled telemedicine application and verified that I am speaking with the correct person using two identifiers.  Patient Location: Home  Provider Location: Office/Clinic  I discussed the limitations of evaluation and management by telemedicine. The patient expressed understanding and agreed to proceed.   Review of Systems    Defer to PCP       Objective:    Today's Vitals   06/08/22 0909  Weight: 245 lb (111.1 kg)  Height: 6\' 1"  (1.854 m)   Body mass index is 32.32 kg/m.     06/08/2022    9:00 AM  Advanced Directives  Does Patient Have a Medical Advance Directive? Yes  Type of Advance Directive Living will    Current Medications (verified) Outpatient Encounter Medications as of 06/08/2022  Medication Sig   allopurinol (ZYLOPRIM) 100 MG tablet Take 1 tablet by mouth once daily   aspirin EC 81 MG tablet Take by mouth daily.   citalopram (CELEXA) 40 MG tablet Take 1 tablet by mouth once daily   clonazePAM (KLONOPIN) 0.5 MG tablet Take 1 tablet (0.5 mg total) by mouth daily as needed for anxiety.   colchicine 0.6 MG tablet Take by mouth as needed.    ezetimibe (ZETIA) 10 MG tablet Take 1 tablet by mouth once daily   isosorbide mononitrate (IMDUR) 30 MG 24 hr tablet Take 1 tablet by mouth once daily   metFORMIN (GLUCOPHAGE-XR) 750 MG 24 hr tablet TAKE 2 TABLETS BY MOUTH ONCE DAILY WITH BREAKFAST   metoprolol succinate (TOPROL-XL) 50 MG 24 hr tablet TAKE 1 TABLET BY MOUTH ONCE DAILY TAKE  WITH  OR  IMMEDIATELY  FOLLOWING  A  MEAL.   nitroGLYCERIN (NITROSTAT) 0.4 MG SL tablet Place under the tongue.   olopatadine (PATANOL) 0.1 % ophthalmic solution 1 drop as needed.   ramipril (ALTACE) 10 MG capsule Take 1 capsule by mouth once daily   rosuvastatin (CRESTOR) 40 MG tablet Take 1 tablet by mouth once daily    Semaglutide (RYBELSUS) 7 MG TABS Take 7 mg by mouth daily.   fluticasone (FLONASE) 50 MCG/ACT nasal spray Place 2 sprays into both nostrils daily. (Patient not taking: Reported on 06/08/2022)   No facility-administered encounter medications on file as of 06/08/2022.    Allergies (verified) Patient has no known allergies.   History: Past Medical History:  Diagnosis Date   Anxiety    CAD (coronary artery disease)    2V CABG in 2012   Diabetes mellitus without complication (HCC)    Hyperlipidemia    Hypertension    Past Surgical History:  Procedure Laterality Date   CORONARY ARTERY BYPASS GRAFT     2V CABG in [place]   Family History  Problem Relation Age of Onset   Cancer Mother    Heart attack Father 30   Diabetes Brother    Social History   Socioeconomic History   Marital status: Married    Spouse name: Not on file   Number of children: 2   Years of education: Not on file   Highest education level: Not on file  Occupational History   Occupation: Retired-Sold building supplies  Tobacco Use   Smoking status: Never   Smokeless tobacco: Never  Vaping Use   Vaping Use: Never used  Substance and Sexual Activity   Alcohol use: Never  Drug use: Never   Sexual activity: Not on file  Other Topics Concern   Not on file  Social History Narrative   Not on file   Social Determinants of Health   Financial Resource Strain: Low Risk  (06/08/2022)   Overall Financial Resource Strain (CARDIA)    Difficulty of Paying Living Expenses: Not hard at all  Food Insecurity: No Food Insecurity (06/08/2022)   Hunger Vital Sign    Worried About Running Out of Food in the Last Year: Never true    Ran Out of Food in the Last Year: Never true  Transportation Needs: No Transportation Needs (06/08/2022)   PRAPARE - Administrator, Civil Service (Medical): No    Lack of Transportation (Non-Medical): No  Physical Activity: Sufficiently Active (06/08/2022)   Exercise Vital Sign     Days of Exercise per Week: 4 days    Minutes of Exercise per Session: 100 min  Stress: No Stress Concern Present (06/08/2022)   [employer] of Occupational Health - Occupational Stress Questionnaire    Feeling of Stress : Not at all  Social Connections: Moderately Integrated (06/08/2022)   Social Connection and Isolation Panel [NHANES]    Frequency of Communication with Friends and Family: More than three times a week    Frequency of Social Gatherings with Friends and Family: More than three times a week    Attends Religious Services: 1 to 4 times per year    Active Member of [employer] or Organizations: No    Attends Engineer, structural: Never    Marital Status: Married    Tobacco Counseling Counseling given: Not Answered   Clinical Intake:     Pain : No/denies pain     Nutritional Status: BMI > 30  Obese Nutritional Risks: None  How often do you need to have someone help you when you read instructions, pamphlets, or other written materials from your doctor or pharmacy?: 1 - Never What is the last grade level you completed in school?: 1 year of college  Diabetic?yes  Interpreter Needed?: No      Activities of Daily Living    06/08/2022    9:13 AM  In your present state of health, do you have any difficulty performing the following activities:  Hearing? 0  Vision? 0  Difficulty concentrating or making decisions? 0  Walking or climbing stairs? 0  Dressing or bathing? 0  Doing errands, shopping? 0    Patient Care Team: [name], MD as PCP - General (Internal Medicine) Kathleene Hazel, MD as PCP - Cardiology (Cardiology)  Indicate any recent Medical Services you may have received from other than [facility] providers in the past year (date may be approximate).     Assessment:   This is a routine wellness examination for Ved.  Hearing/Vision screen No results found.  Dietary issues and exercise activities discussed: Current Exercise  Habits: Home exercise routine, Type of exercise: Other - see comments (Patient plays pick up ball and play golf), Time (Minutes): > 60, Frequency (Times/Week): 4, Weekly Exercise (Minutes/Week): 0, Intensity: Moderate, Exercise limited by: None identified   Goals Addressed   None   Depression Screen    06/08/2022    9:02 AM 02/08/2022    8:21 AM 07/12/2021    9:32 AM 01/30/2021    9:55 AM 11/10/2020    3:42 PM 04/04/2020   10:31 AM 10/05/2019    9:54 AM  PHQ 2/9 Scores  PHQ - 2 Score  0 0 0 0 0 0 0  PHQ- 9 Score   0 0  2     Fall Risk    06/08/2022    9:01 AM 02/08/2022    8:21 AM 11/10/2020    3:42 PM 04/04/2020    9:57 AM 10/05/2019    9:54 AM  Fall Risk   Falls in the past year? 0 0 0 0 0  Number falls in past yr:  0     Injury with Fall?  0       FALL RISK PREVENTION PERTAINING TO THE HOME:  Any stairs in or around the home? No  If so, are there any without handrails? No  Home free of loose throw rugs in walkways, pet beds, electrical cords, etc? Yes  Adequate lighting in your home to reduce risk of falls? Yes   ASSISTIVE DEVICES UTILIZED TO PREVENT FALLS:  Life alert? No  Use of a cane, walker or w/c? No  Grab bars in the bathroom? No  Shower chair or bench in shower? No  Elevated toilet seat or a handicapped toilet? No   TIMED UP AND GO:  Was the test performed? No .  Length of time to ambulate 10 feet: n/a sec.     Cognitive Function:        06/08/2022    9:06 AM  6CIT Screen  What Year? 0 points  What month? 0 points  What time? 0 points  Count back from 20 0 points  Months in reverse 0 points  Repeat phrase 0 points  Total Score 0 points    Immunizations Immunization History  Administered Date(s) Administered   PFIZER(Purple Top)SARS-COV-2 Vaccination 05/07/2020, 06/04/2020   Pneumococcal Conjugate-13 04/14/2019   Tdap 07/12/2021   Zoster Recombinat (Shingrix) 12/01/2018, 10/09/2019   Zoster, Live 10/01/2013    TDAP status: Up to  date  Flu Vaccine status: Up to date  Pneumococcal vaccine status: Due, Education has been provided regarding the importance of this vaccine. Advised may receive this vaccine at local pharmacy or Health Dept. Aware to provide a copy of the vaccination record if obtained from local pharmacy or Health Dept. Verbalized acceptance and understanding.  Covid-19 vaccine status: Completed vaccines  Qualifies for Shingles Vaccine? Yes   Zostavax completed Yes   Shingrix Completed?: Yes  Screening Tests Health Maintenance  Topic Date Due   Pneumonia Vaccine 29+ Years old (2 - PPSV23 or PCV20) 04/13/2020   OPHTHALMOLOGY EXAM  04/21/2022   INFLUENZA VACCINE  06/26/2022   Diabetic kidney evaluation - Urine ACR  07/12/2022   HEMOGLOBIN A1C  08/11/2022   Diabetic kidney evaluation - GFR measurement  02/09/2023   FOOT EXAM  02/10/2023   COLONOSCOPY (Pts 45-28yrs Insurance coverage will need to be confirmed)  03/09/2027   TETANUS/TDAP  07/13/2031   Hepatitis C Screening  Completed   Zoster Vaccines- Shingrix  Completed   HPV VACCINES  Aged Out   COVID-19 Vaccine  Discontinued    Health Maintenance  Health Maintenance Due  Topic Date Due   Pneumonia Vaccine 3+ Years old (2 - PPSV23 or PCV20) 04/13/2020   OPHTHALMOLOGY EXAM  04/21/2022    Colorectal cancer screening: Type of screening: Colonoscopy. Completed 03/08/2017. Repeat every 10 years  Lung Cancer Screening: (Low Dose CT Chest recommended if Age 76-80 years, 30 pack-year currently smoking OR have quit w/in 15years.) does not qualify.   Lung Cancer Screening Referral: n/a  Additional Screening:  Hepatitis C Screening: does qualify; Completed 04/14/2019  Vision Screening: Recommended annual ophthalmology exams for early detection of glaucoma and other disorders of the eye. Is the patient up to date with their annual eye exam?  Yes  Who is the provider or what is the name of the office in which the patient attends annual eye  exams? Pt does not remember eye doctor name If pt is not established with a provider, would they like to be referred to a provider to establish care? No .   Dental Screening: Recommended annual dental exams for proper oral hygiene  Community Resource Referral / Chronic Care Management: CRR required this visit?  No   CCM required this visit?  No      Plan:     I have personally reviewed and noted the following in the patient's chart:   Medical and social history Use of alcohol, tobacco or illicit drugs  Current medications and supplements including opioid prescriptions. Patient is not currently taking opioid prescriptions. Functional ability and status Nutritional status Physical activity Advanced directives List of other physicians Hospitalizations, surgeries, and ER visits in previous 12 months Vitals Screenings to include cognitive, depression, and falls Referrals and appointments  In addition, I have reviewed and discussed with patient certain preventive protocols, quality metrics, and best practice recommendations. A written personalized care plan for preventive services as well as general preventive health recommendations were provided to patient.     Mylinda Latina, [place]   06/08/2022  non face-face 30 mins  Nurse Notes: Mr. Bargerstock , Thank you for taking time to come for your Medicare Wellness Visit. I appreciate your ongoing commitment to your health goals. Please review the following plan we discussed and let me know if I can assist you in the future.   These are the goals we discussed:  Goals   None     This is a list of the screening recommended for you and due dates:  Health Maintenance  Topic Date Due   Pneumonia Vaccine (2 - PPSV23 or PCV20) 04/13/2020   Eye exam for diabetics  04/21/2022   Flu Shot  06/26/2022   Yearly kidney health urinalysis for diabetes  07/12/2022   Hemoglobin A1C  08/11/2022   Yearly kidney function blood test for diabetes   02/09/2023   Complete foot exam   02/10/2023   Colon Cancer Screening  03/09/2027   Tetanus Vaccine  07/13/2031   Hepatitis C Screening: USPSTF Recommendation to screen - Ages 18-79 yo.  Completed   Zoster (Shingles) Vaccine  Completed   HPV Vaccine  Aged Out   COVID-19 Vaccine  Discontinued

## 2022-06-08 NOTE — Patient Instructions (Signed)
Health Maintenance, Male Adopting a healthy lifestyle and getting preventive care are important in promoting health and wellness. Ask your health care provider about: The right schedule for you to have regular tests and exams. Things you can do on your own to prevent diseases and keep yourself healthy. What should I know about diet, weight, and exercise? Eat a healthy diet  Eat a diet that includes plenty of vegetables, fruits, low-fat dairy products, and lean protein. Do not eat a lot of foods that are high in solid fats, added sugars, or sodium. Maintain a healthy weight Body mass index (BMI) is a measurement that can be used to identify possible weight problems. It estimates body fat based on height and weight. Your health care provider can help determine your BMI and help you achieve or maintain a healthy weight. Get regular exercise Get regular exercise. This is one of the most important things you can do for your health. Most adults should: Exercise for at least 150 minutes each week. The exercise should increase your heart rate and make you sweat (moderate-intensity exercise). Do strengthening exercises at least twice a week. This is in addition to the moderate-intensity exercise. Spend less time sitting. Even light physical activity can be beneficial. Watch cholesterol and blood lipids Have your blood tested for lipids and cholesterol at 68 years of age, then have this test every 5 years. You may need to have your cholesterol levels checked more often if: Your lipid or cholesterol levels are high. You are older than 68 years of age. You are at high risk for heart disease. What should I know about cancer screening? Many types of cancers can be detected early and may often be prevented. Depending on your health history and family history, you may need to have cancer screening at various ages. This may include screening for: Colorectal cancer. Prostate cancer. Skin cancer. Lung  cancer. What should I know about heart disease, diabetes, and high blood pressure? Blood pressure and heart disease High blood pressure causes heart disease and increases the risk of stroke. This is more likely to develop in people who have high blood pressure readings or are overweight. Talk with your health care provider about your target blood pressure readings. Have your blood pressure checked: Every 3-5 years if you are 18-39 years of age. Every year if you are 40 years old or older. If you are between the ages of 65 and 75 and are a current or former smoker, ask your health care provider if you should have a one-time screening for abdominal aortic aneurysm (AAA). Diabetes Have regular diabetes screenings. This checks your fasting blood sugar level. Have the screening done: Once every three years after age 45 if you are at a normal weight and have a low risk for diabetes. More often and at a younger age if you are overweight or have a high risk for diabetes. What should I know about preventing infection? Hepatitis B If you have a higher risk for hepatitis B, you should be screened for this virus. Talk with your health care provider to find out if you are at risk for hepatitis B infection. Hepatitis C Blood testing is recommended for: Everyone born from 1945 through 1965. Anyone with known risk factors for hepatitis C. Sexually transmitted infections (STIs) You should be screened each year for STIs, including gonorrhea and chlamydia, if: You are sexually active and are younger than 68 years of age. You are older than 68 years of age and your   health care provider tells you that you are at risk for this type of infection. Your sexual activity has changed since you were last screened, and you are at increased risk for chlamydia or gonorrhea. Ask your health care provider if you are at risk. Ask your health care provider about whether you are at high risk for HIV. Your health care provider  may recommend a prescription medicine to help prevent HIV infection. If you choose to take medicine to prevent HIV, you should first get tested for HIV. You should then be tested every 3 months for as long as you are taking the medicine. Follow these instructions at home: Alcohol use Do not drink alcohol if your health care provider tells you not to drink. If you drink alcohol: Limit how much you have to 0-2 drinks a day. Know how much alcohol is in your drink. In the [place] one drink equals one 12 oz bottle of beer (355 mL), one 5 oz glass of wine (148 mL), or one 1 oz glass of hard liquor (44 mL). Lifestyle Do not use any products that contain nicotine or tobacco. These products include cigarettes, chewing tobacco, and vaping devices, such as e-cigarettes. If you need help quitting, ask your health care provider. Do not use street drugs. Do not share needles. Ask your health care provider for help if you need support or information about quitting drugs. General instructions Schedule regular health, dental, and eye exams. Stay current with your vaccines. Tell your health care provider if: You often feel depressed. You have ever been abused or do not feel safe at home. Summary Adopting a healthy lifestyle and getting preventive care are important in promoting health and wellness. Follow your health care provider's instructions about healthy diet, exercising, and getting tested or screened for diseases. Follow your health care provider's instructions on monitoring your cholesterol and blood pressure. This information is not intended to replace advice given to you by your health care provider. Make sure you discuss any questions you have with your health care provider. Document Revised: 04/03/2021 Document Reviewed: 04/03/2021 Elsevier Patient Education  2023 Elsevier Inc.  

## 2022-07-11 ENCOUNTER — Other Ambulatory Visit: Payer: Self-pay | Admitting: Internal Medicine

## 2022-07-11 DIAGNOSIS — E119 Type 2 diabetes mellitus without complications: Secondary | ICD-10-CM

## 2022-07-17 ENCOUNTER — Ambulatory Visit (INDEPENDENT_AMBULATORY_CARE_PROVIDER_SITE_OTHER): Payer: PPO | Admitting: Family Medicine

## 2022-07-17 ENCOUNTER — Encounter: Payer: Self-pay | Admitting: Family Medicine

## 2022-07-17 VITALS — BP 132/90 | HR 65 | Temp 97.6°F | Ht 73.0 in | Wt 252.0 lb

## 2022-07-17 DIAGNOSIS — M545 Low back pain, unspecified: Secondary | ICD-10-CM | POA: Diagnosis not present

## 2022-07-17 MED ORDER — METHOCARBAMOL 500 MG PO TABS: 500.0000 mg | ORAL_TABLET | Freq: Four times a day (QID) | ORAL | 0 refills | Status: DC

## 2022-07-17 NOTE — Progress Notes (Signed)
Subjective:     Patient ID: Shane Garrett, male    DOB: 04-13-1956, 66 y.o.   MRN: [id number]  Chief Complaint  Patient presents with   Back Pain    Lower back and hip pain for about 3-4 weeks. Last week he picked up something really heavy which seemed to trigger it to get worse.     HPI Patient is in today for a 3-4 wk hx of bilateral low back pain. Bending over and getting up and down make pain worse although he is improving.  Sleeping and sitting ok.   Taking ibuprofen and an old prescription of tinazadine which has helped.   Denies fever, chills, dizziness, chest pain, palpitations, shortness of breath, abdominal pain, N/V/D, urinary symptoms, LE edema.   No numbness, tingling, weakness.    Health Maintenance Due  Topic Date Due   Pneumonia Vaccine 18+ Years old (2 - PPSV23 or PCV20) 04/13/2020   OPHTHALMOLOGY EXAM  04/21/2022   Diabetic kidney evaluation - Urine ACR  07/12/2022    Past Medical History:  Diagnosis Date   Anxiety    CAD (coronary artery disease)    2V CABG in 2012   Diabetes mellitus without complication (HCC)    Hyperlipidemia    Hypertension     Past Surgical History:  Procedure Laterality Date   CORONARY ARTERY BYPASS GRAFT     2V CABG in [place]    Family History  Problem Relation Age of Onset   Cancer Mother    Heart attack Father 67   Diabetes Brother     Social History   Socioeconomic History   Marital status: Married    Spouse name: Not on file   Number of children: 2   Years of education: Not on file   Highest education level: Not on file  Occupational History   Occupation: Retired-Sold building supplies  Tobacco Use   Smoking status: Never   Smokeless tobacco: Never  Vaping Use   Vaping Use: Never used  Substance and Sexual Activity   Alcohol use: Never   Drug use: Never   Sexual activity: Not on file  Other Topics Concern   Not on file  Social History Narrative   Not on file   Social Determinants of Health    Financial Resource Strain: Low Risk  (06/08/2022)   Overall Financial Resource Strain (CARDIA)    Difficulty of Paying Living Expenses: Not hard at all  Food Insecurity: No Food Insecurity (06/08/2022)   Hunger Vital Sign    Worried About Running Out of Food in the Last Year: Never true    Ran Out of Food in the Last Year: Never true  Transportation Needs: No Transportation Needs (06/08/2022)   PRAPARE - Administrator, Civil Service (Medical): No    Lack of Transportation (Non-Medical): No  Physical Activity: Sufficiently Active (06/08/2022)   Exercise Vital Sign    Days of Exercise per Week: 4 days    Minutes of Exercise per Session: 100 min  Stress: No Stress Concern Present (06/08/2022)   [employer] of Occupational Health - Occupational Stress Questionnaire    Feeling of Stress : Not at all  Social Connections: Moderately Integrated (06/08/2022)   Social Connection and Isolation Panel [NHANES]    Frequency of Communication with Friends and Family: More than three times a week    Frequency of Social Gatherings with Friends and Family: More than three times a week    Attends Religious Services:  1 to 4 times per year    Active Member of Clubs or Organizations: No    Attends Banker Meetings: Never    Marital Status: Married  Catering manager Violence: Not At Risk (06/08/2022)   Humiliation, Afraid, Rape, and Kick questionnaire    Fear of Current or Ex-Partner: No    Emotionally Abused: No    Physically Abused: No    Sexually Abused: No    Outpatient Medications Prior to Visit  Medication Sig Dispense Refill   allopurinol (ZYLOPRIM) 100 MG tablet Take 1 tablet by mouth once daily 90 tablet 1   aspirin EC 81 MG tablet Take by mouth daily.     citalopram (CELEXA) 40 MG tablet Take 1 tablet by mouth once daily 90 tablet 1   clonazePAM (KLONOPIN) 0.5 MG tablet Take 1 tablet (0.5 mg total) by mouth daily as needed for anxiety. 90 tablet 1   colchicine  0.6 MG tablet Take by mouth as needed.      ezetimibe (ZETIA) 10 MG tablet Take 1 tablet by mouth once daily 90 tablet 0   fluticasone (FLONASE) 50 MCG/ACT nasal spray Place 2 sprays into both nostrils daily. 16 g 0   isosorbide mononitrate (IMDUR) 30 MG 24 hr tablet Take 1 tablet by mouth once daily 90 tablet 3   metFORMIN (GLUCOPHAGE-XR) 750 MG 24 hr tablet TAKE 2 TABLETS BY MOUTH ONCE DAILY WITH BREAKFAST 180 tablet 0   metoprolol succinate (TOPROL-XL) 50 MG 24 hr tablet TAKE 1 TABLET BY MOUTH ONCE DAILY TAKE  WITH  OR  IMMEDIATELY  FOLLOWING  A  MEAL. 90 tablet 0   nitroGLYCERIN (NITROSTAT) 0.4 MG SL tablet Place under the tongue.     olopatadine (PATANOL) 0.1 % ophthalmic solution 1 drop as needed.     ramipril (ALTACE) 10 MG capsule Take 1 capsule by mouth once daily 90 capsule 0   rosuvastatin (CRESTOR) 40 MG tablet Take 1 tablet by mouth once daily 90 tablet 0   Semaglutide (RYBELSUS) 7 MG TABS Take 7 mg by mouth daily. 90 tablet 1   No facility-administered medications prior to visit.    No Known Allergies  ROS Pertinent positives and negatives in the history of present illness.     Objective:    Physical Exam  BP (!) 132/90 (BP Location: Left Arm, Patient Position: Sitting, Cuff Size: Large)   Pulse 65   Temp 97.6 F (36.4 C) (Temporal)   Ht 6\' 1"  (1.854 m)   Wt 252 lb (114.3 kg)   SpO2 98%   BMI 33.25 kg/m  Wt Readings from Last 3 Encounters:  07/17/22 252 lb (114.3 kg)  06/08/22 245 lb (111.1 kg)  02/08/22 257 lb (116.6 kg)   Alert and in no distress. Cardiac exam shows a regular rate and rhythm. Lungs are clear to auscultation.  Good sensation and movement of his back.  Back is nontender.  Bilateral lower extremities normal.  Normal gait.     Assessment & Plan:   Problem List Items Addressed This Visit   None Visit Diagnoses     Acute bilateral low back pain without sciatica    -  Primary   Relevant Medications   methocarbamol (ROBAXIN) 500 MG tablet       Exam benign.  Discussed conservative treatment including ibuprofen, heating pad and stretching.  Back exercises provided today.  He may take Robaxin as needed Follow-up if worsening  I am having 02/10/22 "[name]" start on  methocarbamol. I am also having him maintain his aspirin EC, nitroGLYCERIN, olopatadine, colchicine, fluticasone, isosorbide mononitrate, clonazePAM, Rybelsus, allopurinol, citalopram, ezetimibe, rosuvastatin, ramipril, metoprolol succinate, and metFORMIN.  Meds ordered this encounter  Medications   methocarbamol (ROBAXIN) 500 MG tablet    Sig: Take 1 tablet (500 mg total) by mouth 4 (four) times daily.    Dispense:  30 tablet    Refill:  0    Order Specific Question:   Supervising Provider    Answer:   Hillard Danker A [[id number]]

## 2022-07-17 NOTE — Patient Instructions (Signed)
Continue taking ibuprofen 600 mg or 800 mg as needed for the next week or two.  I also recommend using a heating pad and doing gentle stretching.  If you exercises are listed below.  I prescribed a muscle relaxant called Robaxin to take as needed for moderate or severe pain.  Follow-up if you are not back to baseline in the next couple of weeks.   Back Exercises The following exercises strengthen the muscles that help to support the trunk (torso) and back. They also help to keep the lower back flexible. Doing these exercises can help to prevent or lessen existing low back pain. If you have back pain or discomfort, try doing these exercises 2-3 times each day or as told by your health care provider. As your pain improves, do them once each day, but increase the number of times that you repeat the steps for each exercise (do more repetitions). To prevent the recurrence of back pain, continue to do these exercises once each day or as told by your health care provider. Do exercises exactly as told by your health care provider and adjust them as directed. It is normal to feel mild stretching, pulling, tightness, or discomfort as you do these exercises, but you should stop right away if you feel sudden pain or your pain gets worse. Exercises Single knee to chest Repeat these steps 3-5 times for each leg: Lie on your back on a firm bed or the floor with your legs extended. Bring one knee to your chest. Your other leg should stay extended and in contact with the floor. Hold your knee in place by grabbing your knee or thigh with both hands and hold. Pull on your knee until you feel a gentle stretch in your lower back or buttocks. Hold the stretch for 10-30 seconds. Slowly release and straighten your leg.  Pelvic tilt Repeat these steps 5-10 times: Lie on your back on a firm bed or the floor with your legs extended. Bend your knees so they are pointing toward the ceiling and your feet are flat on  the floor. Tighten your lower abdominal muscles to press your lower back against the floor. This motion will tilt your pelvis so your tailbone points up toward the ceiling instead of pointing to your feet or the floor. With gentle tension and even breathing, hold this position for 5-10 seconds.  Cat-cow Repeat these steps until your lower back becomes more flexible: Get into a hands-and-knees position on a firm bed or the floor. Keep your hands under your shoulders, and keep your knees under your hips. You may place padding under your knees for comfort. Let your head hang down toward your chest. Contract your abdominal muscles and point your tailbone toward the floor so your lower back becomes rounded like the back of a cat. Hold this position for 5 seconds. Slowly lift your head, let your abdominal muscles relax, and point your tailbone up toward the ceiling so your back forms a sagging arch like the back of a cow. Hold this position for 5 seconds.  Press-ups Repeat these steps 5-10 times: Lie on your abdomen (face-down) on a firm bed or the floor. Place your palms near your head, about shoulder-width apart. Keeping your back as relaxed as possible and keeping your hips on the floor, slowly straighten your arms to raise the top half of your body and lift your shoulders. Do not use your back muscles to raise your upper torso. You may adjust the placement  of your hands to make yourself more comfortable. Hold this position for 5 seconds while you keep your back relaxed. Slowly return to lying flat on the floor.  Bridges Repeat these steps 10 times: Lie on your back on a firm bed or the floor. Bend your knees so they are pointing toward the ceiling and your feet are flat on the floor. Your arms should be flat at your sides, next to your body. Tighten your buttocks muscles and lift your buttocks off the floor until your waist is at almost the same height as your knees. You should feel the muscles  working in your buttocks and the back of your thighs. If you do not feel these muscles, slide your feet 1-2 inches (2.5-5 cm) farther away from your buttocks. Hold this position for 3-5 seconds. Slowly lower your hips to the starting position, and allow your buttocks muscles to relax completely. If this exercise is too easy, try doing it with your arms crossed over your chest. Abdominal crunches Repeat these steps 5-10 times: Lie on your back on a firm bed or the floor with your legs extended. Bend your knees so they are pointing toward the ceiling and your feet are flat on the floor. Cross your arms over your chest. Tip your chin slightly toward your chest without bending your neck. Tighten your abdominal muscles and slowly raise your torso high enough to lift your shoulder blades a tiny bit off the floor. Avoid raising your torso higher than that because it can put too much stress on your lower back and does not help to strengthen your abdominal muscles. Slowly return to your starting position.  Back lifts Repeat these steps 5-10 times: Lie on your abdomen (face-down) with your arms at your sides, and rest your forehead on the floor. Tighten the muscles in your legs and your buttocks. Slowly lift your chest off the floor while you keep your hips pressed to the floor. Keep the back of your head in line with the curve in your back. Your eyes should be looking at the floor. Hold this position for 3-5 seconds. Slowly return to your starting position.  Contact a health care provider if: Your back pain or discomfort gets much worse when you do an exercise. Your worsening back pain or discomfort does not lessen within 2 hours after you exercise. If you have any of these problems, stop doing these exercises right away. Do not do them again unless your health care provider says that you can. Get help right away if: You develop sudden, severe back pain. If this happens, stop doing the exercises  right away. Do not do them again unless your health care provider says that you can. This information is not intended to replace advice given to you by your health care provider. Make sure you discuss any questions you have with your health care provider. Document Revised: 05/09/2021 Document Reviewed: 01/25/2021 Elsevier Patient Education  2023 [employer].

## 2022-07-19 ENCOUNTER — Ambulatory Visit (INDEPENDENT_AMBULATORY_CARE_PROVIDER_SITE_OTHER): Payer: PPO | Admitting: Internal Medicine

## 2022-07-19 ENCOUNTER — Encounter: Payer: Self-pay | Admitting: Internal Medicine

## 2022-07-19 VITALS — BP 138/88 | HR 62 | Temp 98.1°F | Resp 16 | Ht 73.0 in | Wt 250.0 lb

## 2022-07-19 DIAGNOSIS — M1A0790 Idiopathic chronic gout, unspecified ankle and foot, without tophus (tophi): Secondary | ICD-10-CM | POA: Diagnosis not present

## 2022-07-19 DIAGNOSIS — E785 Hyperlipidemia, unspecified: Secondary | ICD-10-CM

## 2022-07-19 DIAGNOSIS — Z0001 Encounter for general adult medical examination with abnormal findings: Secondary | ICD-10-CM

## 2022-07-19 DIAGNOSIS — I10 Essential (primary) hypertension: Secondary | ICD-10-CM

## 2022-07-19 DIAGNOSIS — Z23 Encounter for immunization: Secondary | ICD-10-CM

## 2022-07-19 DIAGNOSIS — E119 Type 2 diabetes mellitus without complications: Secondary | ICD-10-CM

## 2022-07-19 LAB — LIPID PANEL
Cholesterol: 108 mg/dL (ref 0–200)
HDL: 35 mg/dL — ABNORMAL LOW (ref >39.00)
LDL Cholesterol: 55 mg/dL (ref 0–99)
NonHDL: 73.15
Total CHOL/HDL Ratio: 3
Triglycerides: 91 mg/dL (ref 0.0–149.0)
VLDL: 18.2 mg/dL (ref 0.0–40.0)

## 2022-07-19 LAB — MICROALBUMIN / CREATININE URINE RATIO
Creatinine,U: 124.9 mg/dL
Microalb Creat Ratio: 0.6 mg/g (ref 0.0–30.0)
Microalb, Ur: <0.7 mg/dL (ref 0.0–1.9)

## 2022-07-19 LAB — BASIC METABOLIC PANEL
BUN: 15 mg/dL (ref 6–23)
CO2: 28 mEq/L (ref 19–32)
Calcium: 9.7 mg/dL (ref 8.4–10.5)
Chloride: 107 mEq/L (ref 96–112)
Creatinine, Ser: 1.15 mg/dL (ref 0.40–1.50)
GFR: 65.43 mL/min (ref >60.00)
Glucose, Bld: 116 mg/dL — ABNORMAL HIGH (ref 70–99)
Potassium: 4.2 mEq/L (ref 3.5–5.1)
Sodium: 142 mEq/L (ref 135–145)

## 2022-07-19 LAB — URIC ACID: Uric Acid, Serum: 6.2 mg/dL (ref 4.0–7.8)

## 2022-07-19 LAB — HEPATIC FUNCTION PANEL
ALT: 20 U/L (ref 0–53)
AST: 21 U/L (ref 0–37)
Albumin: 3.9 g/dL (ref 3.5–5.2)
Alkaline Phosphatase: 53 U/L (ref 39–117)
Bilirubin, Direct: 0 mg/dL (ref 0.0–0.3)
Total Bilirubin: 0.4 mg/dL (ref 0.2–1.2)
Total Protein: 6.9 g/dL (ref 6.0–8.3)

## 2022-07-19 LAB — HEMOGLOBIN A1C: Hgb A1c MFr Bld: 7.8 % — ABNORMAL HIGH (ref 4.6–6.5)

## 2022-07-19 MED ORDER — TIRZEPATIDE 2.5 MG/0.5ML ~~LOC~~ SOAJ: 2.5000 mg | SUBCUTANEOUS | 0 refills | Status: DC

## 2022-07-19 NOTE — Progress Notes (Signed)
Subjective:  Patient ID: Shane Garrett, male    DOB: 1956/08/30  Age: 66 y.o. MRN: [id number]  CC: Annual Exam, Hypertension, Hyperlipidemia, and Diabetes   HPI Shane Garrett presents for a CPX and f/up -  He is not taking Rybelsus because it was too expensive.  He complains of weight gain but denies polys.  He is active and denies chest pain, shortness of breath, diaphoresis, or edema.  Outpatient Medications Prior to Visit  Medication Sig Dispense Refill   allopurinol (ZYLOPRIM) 100 MG tablet Take 1 tablet by mouth once daily 90 tablet 1   aspirin EC 81 MG tablet Take by mouth daily.     citalopram (CELEXA) 40 MG tablet Take 1 tablet by mouth once daily 90 tablet 1   clonazePAM (KLONOPIN) 0.5 MG tablet Take 1 tablet (0.5 mg total) by mouth daily as needed for anxiety. 90 tablet 1   colchicine 0.6 MG tablet Take by mouth as needed.      ezetimibe (ZETIA) 10 MG tablet Take 1 tablet by mouth once daily 90 tablet 0   fluticasone (FLONASE) 50 MCG/ACT nasal spray Place 2 sprays into both nostrils daily. 16 g 0   isosorbide mononitrate (IMDUR) 30 MG 24 hr tablet Take 1 tablet by mouth once daily 90 tablet 3   metFORMIN (GLUCOPHAGE-XR) 750 MG 24 hr tablet TAKE 2 TABLETS BY MOUTH ONCE DAILY WITH BREAKFAST 180 tablet 0   methocarbamol (ROBAXIN) 500 MG tablet Take 1 tablet (500 mg total) by mouth 4 (four) times daily. 30 tablet 0   metoprolol succinate (TOPROL-XL) 50 MG 24 hr tablet TAKE 1 TABLET BY MOUTH ONCE DAILY TAKE  WITH  OR  IMMEDIATELY  FOLLOWING  A  MEAL. 90 tablet 0   nitroGLYCERIN (NITROSTAT) 0.4 MG SL tablet Place under the tongue.     olopatadine (PATANOL) 0.1 % ophthalmic solution 1 drop as needed.     ramipril (ALTACE) 10 MG capsule Take 1 capsule by mouth once daily 90 capsule 0   rosuvastatin (CRESTOR) 40 MG tablet Take 1 tablet by mouth once daily 90 tablet 0   Semaglutide (RYBELSUS) 7 MG TABS Take 7 mg by mouth daily. (Patient not taking: Reported on 07/19/2022) 90 tablet 1    No facility-administered medications prior to visit.    ROS Review of Systems  Constitutional:  Positive for unexpected weight change. Negative for chills, diaphoresis, fatigue and fever.  HENT: Negative.    Eyes: Negative.   Respiratory:  Negative for cough, chest tightness, shortness of breath and wheezing.   Cardiovascular:  Negative for chest pain, palpitations and leg swelling.  Gastrointestinal:  Negative for abdominal pain, constipation, diarrhea, nausea and vomiting.  Endocrine: Negative.   Genitourinary: Negative.  Negative for difficulty urinating.  Musculoskeletal:  Positive for back pain. Negative for arthralgias and myalgias.  Skin: Negative.   Neurological:  Negative for dizziness, weakness, light-headedness and numbness.  Hematological:  Negative for adenopathy. Does not bruise/bleed easily.  Psychiatric/Behavioral: Negative.      Objective:  BP 138/88 (BP Location: Left Arm, Patient Position: Sitting, Cuff Size: Large)   Pulse 62   Temp 98.1 F (36.7 C) (Oral)   Resp 16   Ht 6\' 1"  (1.854 m)   Wt 250 lb (113.4 kg)   SpO2 95%   BMI 32.98 kg/m   BP Readings from Last 3 Encounters:  07/19/22 138/88  07/17/22 (!) 132/90  02/08/22 126/82    Wt Readings from Last 3 Encounters:  07/19/22  250 lb (113.4 kg)  07/17/22 252 lb (114.3 kg)  06/08/22 245 lb (111.1 kg)    Physical Exam Vitals reviewed.  Constitutional:      Appearance: Normal appearance.  HENT:     Mouth/Throat:     Mouth: Mucous membranes are moist.  Eyes:     General: No scleral icterus.    Conjunctiva/sclera: Conjunctivae normal.  Cardiovascular:     Rate and Rhythm: Normal rate and regular rhythm.     Heart sounds: No murmur heard. Pulmonary:     Effort: Pulmonary effort is normal.     Breath sounds: No stridor. No wheezing, rhonchi or rales.  Abdominal:     General: Abdomen is flat.     Palpations: There is no mass.     Tenderness: There is no abdominal tenderness. There is no  guarding.     Hernia: No hernia is present.  Musculoskeletal:        General: Normal range of motion.     Cervical back: Neck supple.     Right lower leg: No edema.     Left lower leg: No edema.  Lymphadenopathy:     Cervical: No cervical adenopathy.  Skin:    General: Skin is warm and dry.  Neurological:     General: No focal deficit present.     Mental Status: He is alert.  Psychiatric:        Mood and Affect: Mood normal.        Behavior: Behavior normal.     Lab Results  Component Value Date   WBC 5.7 02/08/2022   HGB 13.7 02/08/2022   HCT 40.6 02/08/2022   PLT 167.0 02/08/2022   GLUCOSE 116 (H) 07/19/2022   CHOL 108 07/19/2022   TRIG 91.0 07/19/2022   HDL 35.00 (L) 07/19/2022   LDLDIRECT 67.0 04/14/2020   LDLCALC 55 07/19/2022   ALT 20 07/19/2022   AST 21 07/19/2022   NA 142 07/19/2022   K 4.2 07/19/2022   CL 107 07/19/2022   CREATININE 1.15 07/19/2022   BUN 15 07/19/2022   CO2 28 07/19/2022   TSH 3.33 02/08/2022   PSA 2.68 02/08/2022   HGBA1C 7.8 (H) 07/19/2022   MICROALBUR <0.7 07/19/2022    CT ANGIO CHEST AORTA W/ & OR WO/CM & GATING ([facility] ONLY)  Result Date: 01/01/2022 CLINICAL DATA:  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 EXAM: CT ANGIOGRAPHY CHEST WITH CONTRAST TECHNIQUE: Multidetector CT imaging of the chest was performed using the standard protocol during bolus administration of intravenous contrast. Multiplanar CT image reconstructions and MIPs were obtained to evaluate the vascular anatomy. RADIATION DOSE REDUCTION: This exam was performed according to the departmental dose-optimization program which includes automated exposure control, adjustment of the mA and/or kV according to patient size and/or use of iterative reconstruction technique. CONTRAST:  42mL OMNIPAQUE IOHEXOL 350 MG/ML SOLN COMPARISON:  Report from an echocardiogram dated 12/18/2021 FINDINGS: Cardiovascular: Caliber of the aortic root is  top-normal measuring 4 cm at the level of the sinuses of Valsalva. The ascending thoracic aorta is top-normal in caliber and not overtly aneurysmal measuring 3.9 cm in greatest estimated diameter. The thoracic aorta is moderately tortuous. The proximal arch measures 3.4 cm and the distal arch 2.8 cm. The descending thoracic aorta measures 2.5 cm. No evidence of aortic dissection. Proximal great vessels demonstrate normal patency and normal branching anatomy. There is evidence of prior CABG. The heart size is normal. No pericardial fluid identified. Central  pulmonary arteries are normal in caliber. Mediastinum/Nodes: No enlarged mediastinal, hilar, or axillary lymph nodes. The trachea, and esophagus demonstrate no significant findings. Low-density mid right thyroid nodule measures approximately 14 mm in greatest diameter. Lungs/Pleura: There is no evidence of pulmonary edema, consolidation, pneumothorax, nodule or pleural fluid. Upper Abdomen: No acute abnormality. Musculoskeletal: No chest wall abnormality. No acute or significant osseous findings. Review of the MIP images confirms the above findings. IMPRESSION: 1. No overt aneurysmal dilatation of the thoracic aorta. The aortic root and ascending thoracic aorta are top-normal in caliber with the root measuring 4 cm and the ascending thoracic aorta at 3.9 cm. The thoracic aorta is also moderately tortuous. 2. 1.4 cm right thyroid nodule. 1.4 cm incidental right thyroid nodule. No follow-up imaging is recommended. Reference: J Am Coll Radiol. 2015 Feb;12(2): 143-50 Electronically Signed   By: Irish Lack M.D.   On: 01/01/2022 09:33    Assessment & Plan:   Jahden was seen today for annual exam, hypertension, hyperlipidemia and diabetes.  Diagnoses and all orders for this visit:  Primary hypertension- His blood pressure is adequately well controlled. -     Basic metabolic panel; Future -     Basic metabolic panel  Type 2 diabetes mellitus without  complication, without long-term current use of insulin (HCC)- His A1c remains at 7.8%.  Will start a GLP/GIP agonist. -     Microalbumin / creatinine urine ratio; Future -     Basic metabolic panel; Future -     Hemoglobin A1c; Future -     tirzepatide [facility]) 2.5 MG/0.5ML Pen; Inject 2.5 mg into the skin once a week. -     Hemoglobin A1c -     Basic metabolic panel -     Microalbumin / creatinine urine ratio  Idiopathic chronic gout of foot without tophus, unspecified laterality- He has achieved his uric acid goal. -     Uric acid; Future -     Uric acid  Hyperlipidemia with target LDL less than 70- LDL goal achieved. Doing well on the statin  -     Lipid panel; Future -     Hepatic function panel; Future -     Hepatic function panel -     Lipid panel  Encounter for general adult medical examination with abnormal findings- Exam completed, labs reviewed, vaccines reviewed and updated, cancer screenings are up-to-date, patient education was given.  Other orders -     Pneumococcal polysaccharide vaccine 23-valent greater than or equal to 2yo subcutaneous/IM   I have discontinued [name] "Steve"'s Rybelsus. I am also having him start on tirzepatide. Additionally, I am having him maintain his aspirin EC, nitroGLYCERIN, olopatadine, colchicine, fluticasone, isosorbide mononitrate, clonazePAM, allopurinol, citalopram, ezetimibe, rosuvastatin, ramipril, metoprolol succinate, metFORMIN, and methocarbamol.  Meds ordered this encounter  Medications   tirzepatide (MOUNJARO) 2.5 MG/0.5ML Pen    Sig: Inject 2.5 mg into the skin once a week.    Dispense:  2 mL    Refill:  0     Follow-up: Return in about 6 months (around 01/19/2023).  Sanda Linger, MD

## 2022-07-19 NOTE — Patient Instructions (Signed)
Health Maintenance, Male Adopting a healthy lifestyle and getting preventive care are important in promoting health and wellness. Ask your health care provider about: The right schedule for you to have regular tests and exams. Things you can do on your own to prevent diseases and keep yourself healthy. What should I know about diet, weight, and exercise? Eat a healthy diet  Eat a diet that includes plenty of vegetables, fruits, low-fat dairy products, and lean protein. Do not eat a lot of foods that are high in solid fats, added sugars, or sodium. Maintain a healthy weight Body mass index (BMI) is a measurement that can be used to identify possible weight problems. It estimates body fat based on height and weight. Your health care provider can help determine your BMI and help you achieve or maintain a healthy weight. Get regular exercise Get regular exercise. This is one of the most important things you can do for your health. Most adults should: Exercise for at least 150 minutes each week. The exercise should increase your heart rate and make you sweat (moderate-intensity exercise). Do strengthening exercises at least twice a week. This is in addition to the moderate-intensity exercise. Spend less time sitting. Even light physical activity can be beneficial. Watch cholesterol and blood lipids Have your blood tested for lipids and cholesterol at 68 years of age, then have this test every 5 years. You may need to have your cholesterol levels checked more often if: Your lipid or cholesterol levels are high. You are older than 68 years of age. You are at high risk for heart disease. What should I know about cancer screening? Many types of cancers can be detected early and may often be prevented. Depending on your health history and family history, you may need to have cancer screening at various ages. This may include screening for: Colorectal cancer. Prostate cancer. Skin cancer. Lung  cancer. What should I know about heart disease, diabetes, and high blood pressure? Blood pressure and heart disease High blood pressure causes heart disease and increases the risk of stroke. This is more likely to develop in people who have high blood pressure readings or are overweight. Talk with your health care provider about your target blood pressure readings. Have your blood pressure checked: Every 3-5 years if you are 18-39 years of age. Every year if you are 40 years old or older. If you are between the ages of 65 and 75 and are a current or former smoker, ask your health care provider if you should have a one-time screening for abdominal aortic aneurysm (AAA). Diabetes Have regular diabetes screenings. This checks your fasting blood sugar level. Have the screening done: Once every three years after age 45 if you are at a normal weight and have a low risk for diabetes. More often and at a younger age if you are overweight or have a high risk for diabetes. What should I know about preventing infection? Hepatitis B If you have a higher risk for hepatitis B, you should be screened for this virus. Talk with your health care provider to find out if you are at risk for hepatitis B infection. Hepatitis C Blood testing is recommended for: Everyone born from 1945 through 1965. Anyone with known risk factors for hepatitis C. Sexually transmitted infections (STIs) You should be screened each year for STIs, including gonorrhea and chlamydia, if: You are sexually active and are younger than 68 years of age. You are older than 68 years of age and your   health care provider tells you that you are at risk for this type of infection. Your sexual activity has changed since you were last screened, and you are at increased risk for chlamydia or gonorrhea. Ask your health care provider if you are at risk. Ask your health care provider about whether you are at high risk for HIV. Your health care provider  may recommend a prescription medicine to help prevent HIV infection. If you choose to take medicine to prevent HIV, you should first get tested for HIV. You should then be tested every 3 months for as long as you are taking the medicine. Follow these instructions at home: Alcohol use Do not drink alcohol if your health care provider tells you not to drink. If you drink alcohol: Limit how much you have to 0-2 drinks a day. Know how much alcohol is in your drink. In the [place] one drink equals one 12 oz bottle of beer (355 mL), one 5 oz glass of wine (148 mL), or one 1 oz glass of hard liquor (44 mL). Lifestyle Do not use any products that contain nicotine or tobacco. These products include cigarettes, chewing tobacco, and vaping devices, such as e-cigarettes. If you need help quitting, ask your health care provider. Do not use street drugs. Do not share needles. Ask your health care provider for help if you need support or information about quitting drugs. General instructions Schedule regular health, dental, and eye exams. Stay current with your vaccines. Tell your health care provider if: You often feel depressed. You have ever been abused or do not feel safe at home. Summary Adopting a healthy lifestyle and getting preventive care are important in promoting health and wellness. Follow your health care provider's instructions about healthy diet, exercising, and getting tested or screened for diseases. Follow your health care provider's instructions on monitoring your cholesterol and blood pressure. This information is not intended to replace advice given to you by your health care provider. Make sure you discuss any questions you have with your health care provider. Document Revised: 04/03/2021 Document Reviewed: 04/03/2021 Elsevier Patient Education  2023 Elsevier Inc.  

## 2022-08-14 ENCOUNTER — Other Ambulatory Visit: Payer: Self-pay | Admitting: Internal Medicine

## 2022-08-14 ENCOUNTER — Telehealth: Payer: Self-pay

## 2022-08-14 DIAGNOSIS — E119 Type 2 diabetes mellitus without complications: Secondary | ICD-10-CM

## 2022-08-14 MED ORDER — TIRZEPATIDE 5 MG/0.5ML ~~LOC~~ SOAJ: 5.0000 mg | SUBCUTANEOUS | 0 refills | Status: DC

## 2022-08-14 NOTE — Telephone Encounter (Signed)
Pt is calling to requesting a refill and increase the dosage on  tirzepatide [facility]) 2.5 MG/0.5ML Pen  Pharmacy: [facility]  LOV 07/19/22 ROV 01/24/23

## 2022-08-16 ENCOUNTER — Other Ambulatory Visit: Payer: Self-pay | Admitting: Internal Medicine

## 2022-08-16 DIAGNOSIS — E785 Hyperlipidemia, unspecified: Secondary | ICD-10-CM

## 2022-08-16 DIAGNOSIS — I25118 Atherosclerotic heart disease of native coronary artery with other forms of angina pectoris: Secondary | ICD-10-CM

## 2022-08-29 ENCOUNTER — Other Ambulatory Visit: Payer: Self-pay | Admitting: Internal Medicine

## 2022-08-29 DIAGNOSIS — I10 Essential (primary) hypertension: Secondary | ICD-10-CM

## 2022-09-06 ENCOUNTER — Other Ambulatory Visit: Payer: Self-pay | Admitting: Internal Medicine

## 2022-09-06 DIAGNOSIS — E119 Type 2 diabetes mellitus without complications: Secondary | ICD-10-CM

## 2022-09-07 ENCOUNTER — Other Ambulatory Visit: Payer: Self-pay | Admitting: Internal Medicine

## 2022-09-07 DIAGNOSIS — E119 Type 2 diabetes mellitus without complications: Secondary | ICD-10-CM

## 2022-09-07 MED ORDER — TIRZEPATIDE 7.5 MG/0.5ML ~~LOC~~ SOAJ
7.5000 mg | SUBCUTANEOUS | 0 refills | Status: DC
Start: 2022-09-07 — End: 2022-10-04

## 2022-09-17 ENCOUNTER — Other Ambulatory Visit: Payer: Self-pay | Admitting: Internal Medicine

## 2022-09-17 DIAGNOSIS — F325 Major depressive disorder, single episode, in full remission: Secondary | ICD-10-CM

## 2022-09-17 DIAGNOSIS — M1A0790 Idiopathic chronic gout, unspecified ankle and foot, without tophus (tophi): Secondary | ICD-10-CM

## 2022-10-02 ENCOUNTER — Telehealth: Payer: Self-pay | Admitting: Internal Medicine

## 2022-10-02 NOTE — Telephone Encounter (Signed)
Patient called and said he is on irzepatide [facility]) 7.5 MG/0.5ML Pen  and he said he has lost 15 lbs with it and he said that a little over a week ago he got dizzy and thought he was going to pass out, He said his wife checked his BP and it was low, He said his wife told him he should hold off on the isosorbide mononitrate (IMDUR) 30 MG 24 hr tablet, He stopped it the next day after he checked his BP and was off of it for 6 days and was fine until the 6th day when he had chest pains and had to take nitro glycerin. He was wondering since he lost the weight if his medication should be re-evaluated. He said the chest pain was yesterday but is fine today. He did whole pill of isosorbide yesterday and half a tablet today. What does he need to do? Call back is [phone]

## 2022-10-02 NOTE — Telephone Encounter (Signed)
Gave advisement from [employer] with ER precautions. Will remove ramipril from mediation list. DOD visit made for 11/9 at 11:30 am.  The patient verbalized understanding and agreement.

## 2022-10-02 NOTE — Telephone Encounter (Signed)
Please schedule pt for OV ASAP, appropriate for DOD slot Would continue isosorbide since this historically does not lower BP too much but stop ramipril completely for now. However, if CP recurs or any new/worsening symptoms -> ER

## 2022-10-03 NOTE — Progress Notes (Unsigned)
Cardiology Office Note:    Date:  10/04/2022   ID:  Shane Garrett, DOB Apr 01, 1956, MRN [id number]  PCP:  [name], MD   [facility] Providers Cardiologist:  Alverda Skeans, MD (DOD for Shane Garrett) Referring MD: [name], MD   Chief Complaint/Reason for Referral: DOD visit for chest pain  ASSESSMENT:    1. Coronary artery disease of native artery of native heart with stable angina pectoris (HCC)   2. Type 2 diabetes mellitus without complication, without long-term current use of insulin (HCC)   3. Hypertension associated with diabetes (HCC)   4. Hyperlipidemia associated with type 2 diabetes mellitus (HCC)   5. Dilation of aorta (HCC)     PLAN:    In order of problems listed above: 1.  Coronary artery disease: The patient had recurrence of angina while off Imdur.  He is now back on Imdur and has had no recurrence.  We will continue medical therapy with aspirin, Toprol, and Imdur.  We will have him follow-up as previously scheduled in January. 2.  Type 2 diabetes: Continue aspirin and rosuvastatin.  Consider Jardiance; if blood pressure increases consider ARB or ACE inhibitor. 4.  Hyperlipidemia: Recently it was at goal. 5.  Aortic dilatation: CT done earlier this year was reassuring and showed no aneurysm.             Dispo: Follow-up in January as previously scheduled.     Medication Adjustments/Labs and Tests Ordered: Current medicines are reviewed at length with the patient today.  Concerns regarding medicines are outlined above.  The following changes have been made:  no change   Labs/tests ordered: Orders Placed This Encounter  Procedures   EKG 12-Lead    Medication Changes: No orders of the defined types were placed in this encounter.    Current medicines are reviewed at length with the patient today.  The patient does not have concerns regarding medicines.   History of Present Illness:    FOCUSED PROBLEM LIST:   1.  Coronary artery  disease status post two-vessel CABG in 2012 in [place] 2.  Hypertension 3.  Hyperlipidemia 4.  Type 2 diabetes on metformin 5.  BMI 31  The patient is a 66 y.o. male with the indicated medical history here for expedited evaluation regarding chest pain.  The patient is a normally cared for by Dr. Clifton Garrett.  I am seeing the patient as the DOD.  The patient tells me that he started [facility] a few months ago and lost about 15 pounds.  About 2 weeks ago he was lightheaded and his wife who is a nurse noted he had a low blood pressure.  For this reason she suggested that he stop his Imdur.  He was off Imdur for about 6 days and then earlier this week on Monday he was playing pickle ball and developed some chest discomfort.  He stopped playing and the chest discomfort did go away.  He restarting his Imdur and he took an aspirin.  He has had no recurrence of symptoms over the last 48 hours.  An EKG done today demonstrates no ischemic changes.  He feels relatively well.          Current Medications: Current Meds  Medication Sig   allopurinol (ZYLOPRIM) 100 MG tablet Take 1 tablet by mouth once daily   aspirin EC 81 MG tablet Take by mouth daily.   citalopram (CELEXA) 40 MG tablet Take 1 tablet by mouth once daily   clonazePAM (  KLONOPIN) 0.5 MG tablet Take 1 tablet (0.5 mg total) by mouth daily as needed for anxiety.   colchicine 0.6 MG tablet Take by mouth as needed.    ezetimibe (ZETIA) 10 MG tablet Take 1 tablet by mouth once daily   fluticasone (FLONASE) 50 MCG/ACT nasal spray Place 2 sprays into both nostrils daily.   isosorbide mononitrate (IMDUR) 30 MG 24 hr tablet Take 1 tablet by mouth once daily   metFORMIN (GLUCOPHAGE-XR) 750 MG 24 hr tablet TAKE 2 TABLETS BY MOUTH ONCE DAILY WITH BREAKFAST   methocarbamol (ROBAXIN) 500 MG tablet Take 1 tablet (500 mg total) by mouth 4 (four) times daily.   metoprolol succinate (TOPROL-XL) 50 MG 24 hr tablet TAKE 1 TABLET BY MOUTH ONCE DAILY WITH  OR   IMMEDIATELY  FOLLOWING  A  MEAL   nitroGLYCERIN (NITROSTAT) 0.4 MG SL tablet Place under the tongue.   olopatadine (PATANOL) 0.1 % ophthalmic solution 1 drop as needed.   rosuvastatin (CRESTOR) 40 MG tablet Take 1 tablet by mouth once daily   tirzepatide (MOUNJARO) 7.5 MG/0.5ML Pen Inject 7.5 mg into the skin once a week.     Allergies:    Patient has no known allergies.   Social History:   Social History   Tobacco Use   Smoking status: Never   Smokeless tobacco: Never  Vaping Use   Vaping Use: Never used  Substance Use Topics   Alcohol use: Never   Drug use: Never     Family Hx: Family History  Problem Relation Age of Onset   Cancer Mother    Heart attack Father 10   Diabetes Brother      Review of Systems:   Please see the history of present illness.    All other systems reviewed and are negative.     EKGs/Labs/Other Test Reviewed:    EKG:  EKG performed January 2023 that I personally reviewed demonstrates rhythm with nonspecific ST and T wave changes; EKG performed today that I personally reviewed demonstrates sinus rhythm with first-degree AV block and nonspecific ST and T wave changes.  Prior CV studies:  TTE 2023:  1. Left ventricular ejection fraction, by estimation, is 60 to 65%. The  left ventricle has normal function. The left ventricle has no regional  wall motion abnormalities. Left ventricular diastolic parameters are  indeterminate.   2. Right ventricular systolic function is normal. The right ventricular  size is normal.   3. Left atrial size was mildly dilated.   4. The mitral valve is normal in structure. Mild mitral valve  regurgitation.   5. The aortic valve is normal in structure. Aortic valve regurgitation is  not visualized.   6. Aortic dilatation noted. There is mild dilatation of the ascending  aorta, measuring 43 mm.   7. The inferior vena cava is normal in size with greater than 50%  respiratory variability, suggesting right atrial  pressure of 3 mmHg.    OSH Lexiscan 2020: 1. No significant reversible ischemia or infarction.  2. Normal left ventricular wall motion.  3. Left ventricular ejection fraction 53%  4. Non invasive risk stratification*: Low   Other studies Reviewed: Review of the additional studies/records demonstrates:   CT 2023: 1. No overt aneurysmal dilatation of the thoracic aorta. The aortic root and ascending thoracic aorta are top-normal in caliber with the root measuring 4 cm and the ascending thoracic aorta at 3.9 cm. The thoracic aorta is also moderately tortuous. 2. 1.4 cm right thyroid nodule. 1.4  cm incidental right thyroid nodule. No follow-up imaging is recommended.  Recent Labs: 02/08/2022: Hemoglobin 13.7; Platelets 167.0; TSH 3.33 07/19/2022: ALT 20; BUN 15; Creatinine, Ser 1.15; Potassium 4.2; Sodium 142   Recent Lipid Panel Lab Results  Component Value Date/Time   CHOL 108 07/19/2022 08:54 AM   TRIG 91.0 07/19/2022 08:54 AM   HDL 35.00 (L) 07/19/2022 08:54 AM   LDLCALC 55 07/19/2022 08:54 AM   LDLDIRECT 67.0 04/14/2020 10:50 AM    Risk Assessment/Calculations:                Physical Exam:    VS:  BP 112/82   Pulse 64   Ht 6\' 1"  (1.854 m)   Wt 240 lb (108.9 kg)   SpO2 96%   BMI 31.66 kg/m    Wt Readings from Last 3 Encounters:  10/04/22 240 lb (108.9 kg)  07/19/22 250 lb (113.4 kg)  07/17/22 252 lb (114.3 kg)    GENERAL:  No apparent distress, AOx3 HEENT:  No carotid bruits, +2 carotid impulses, no scleral icterus CAR: RRR no murmurs, gallops, rubs, or thrills RES:  Clear to auscultation bilaterally ABD:  Soft, nontender, nondistended, positive bowel sounds x 4 VASC:  +2 radial pulses, +2 carotid pulses, palpable pedal pulses NEURO:  CN 2-12 grossly intact; motor and sensory grossly intact PSYCH:  No active depression or anxiety EXT:  No edema, ecchymosis, or cyanosis  Signed, 07/19/22, MD  10/04/2022 11:37 AM    [facility]  [facility]Care [address] [place], [place], [place]  [place] Phone: [phone]; Fax: [phone]   Note:  This document was prepared using Dragon voice recognition software and may include unintentional dictation errors.

## 2022-10-04 ENCOUNTER — Telehealth: Payer: Self-pay | Admitting: Internal Medicine

## 2022-10-04 ENCOUNTER — Other Ambulatory Visit: Payer: Self-pay | Admitting: Internal Medicine

## 2022-10-04 ENCOUNTER — Ambulatory Visit: Payer: PPO | Attending: Internal Medicine | Admitting: Internal Medicine

## 2022-10-04 ENCOUNTER — Encounter: Payer: Self-pay | Admitting: Internal Medicine

## 2022-10-04 VITALS — BP 112/82 | HR 64 | Ht 73.0 in | Wt 240.0 lb

## 2022-10-04 DIAGNOSIS — E119 Type 2 diabetes mellitus without complications: Secondary | ICD-10-CM

## 2022-10-04 DIAGNOSIS — E1159 Type 2 diabetes mellitus with other circulatory complications: Secondary | ICD-10-CM | POA: Diagnosis not present

## 2022-10-04 DIAGNOSIS — E1169 Type 2 diabetes mellitus with other specified complication: Secondary | ICD-10-CM | POA: Diagnosis not present

## 2022-10-04 DIAGNOSIS — I77819 Aortic ectasia, unspecified site: Secondary | ICD-10-CM

## 2022-10-04 DIAGNOSIS — I25118 Atherosclerotic heart disease of native coronary artery with other forms of angina pectoris: Secondary | ICD-10-CM

## 2022-10-04 DIAGNOSIS — I152 Hypertension secondary to endocrine disorders: Secondary | ICD-10-CM

## 2022-10-04 DIAGNOSIS — E785 Hyperlipidemia, unspecified: Secondary | ICD-10-CM

## 2022-10-04 MED ORDER — TIRZEPATIDE 7.5 MG/0.5ML ~~LOC~~ SOAJ: 7.5000 mg | SUBCUTANEOUS | 0 refills | Status: DC

## 2022-10-04 NOTE — Telephone Encounter (Signed)
Patient needs his mounjaro refilled - He would like to continue on the same dose as he is on right now.  Please send this to [facility] on [address] in [place], [place].  Next visit:  01/24/2023

## 2022-10-04 NOTE — Patient Instructions (Signed)
Medication Instructions:  No changes *If you need a refill on your cardiac medications before your next appointment, please call your pharmacy*   Lab Work: None  Testing/Procedures: none   Follow-Up: As planned with Dr. [name]   Important Information About Sugar

## 2022-10-11 ENCOUNTER — Telehealth: Payer: Self-pay | Admitting: Internal Medicine

## 2022-10-11 NOTE — Telephone Encounter (Signed)
Contacted pt with the update of form completion. Informed pt his provider will return to the office on Monday 10/15/22. Pt verbalized understanding without complaint.

## 2022-10-11 NOTE — Telephone Encounter (Signed)
Form has been signed by PCP and faxed back.

## 2022-10-11 NOTE — Telephone Encounter (Signed)
Form has been completed and given to PCP to sign ?

## 2022-10-11 NOTE — Telephone Encounter (Signed)
Patient came in and dropped off  a Chronic Condition Verification Form to be filled out by Dr. Yetta Barre, Patient would like [place] to fax form to [phone]. Form was placed in Dr. Yetta Barre box at the front

## 2022-11-05 ENCOUNTER — Other Ambulatory Visit: Payer: Self-pay | Admitting: Internal Medicine

## 2022-11-05 DIAGNOSIS — E119 Type 2 diabetes mellitus without complications: Secondary | ICD-10-CM

## 2022-11-28 ENCOUNTER — Other Ambulatory Visit: Payer: Self-pay | Admitting: Internal Medicine

## 2022-11-28 DIAGNOSIS — I10 Essential (primary) hypertension: Secondary | ICD-10-CM

## 2022-12-12 ENCOUNTER — Other Ambulatory Visit: Payer: Self-pay | Admitting: Internal Medicine

## 2022-12-12 ENCOUNTER — Telehealth: Payer: PPO | Admitting: Internal Medicine

## 2022-12-12 DIAGNOSIS — F325 Major depressive disorder, single episode, in full remission: Secondary | ICD-10-CM

## 2022-12-12 NOTE — Telephone Encounter (Signed)
[employer] called in reference to a medication the patient is taking, Mounjaro 7.5/0.5 mL pen, a good callback number is [phone]

## 2022-12-13 NOTE — Progress Notes (Signed)
Chief Complaint  Patient presents with   Follow-up    CAD   History of Present Illness: 67 yo male with history of CAD s/p 2V CABG in 2012, anxiety, HTN, hyperlipidemia and DM who is here today for follow up. I saw him as a new consult for evaluation of his CAD in November 2021. He had previously been followed by cardiology in [place], [place]. He had a 2V CABG in 2012 in [place]. He has had no cardiac caths since his bypass surgery. He was admitted to [facility] in May 2020 with chest pain and had a normal nuclear stress test then. Echo January 2023 with LVEF=60-65%, mild MR. Mild dilation of the ascending aorta. Chest CTA February 2023 with 3.9 cm ascending aorta. He has been managed on Imdur and had some chest pain in the Fall of 2023 when he held his Imdur  He is here today for follow up. The patient denies any chest pain, dyspnea, palpitations, lower extremity edema, orthopnea, PND, dizziness, near syncope or syncope.   He is retired. He enjoys playing golf and fishing.   Primary Care Physician: [name], MD   Past Medical History:  Diagnosis Date   Anxiety    CAD (coronary artery disease)    2V CABG in 2012   Diabetes mellitus without complication (HCC)    Hyperlipidemia    Hypertension     Past Surgical History:  Procedure Laterality Date   CORONARY ARTERY BYPASS GRAFT     2V CABG in [place]    Current Outpatient Medications  Medication Sig Dispense Refill   allopurinol (ZYLOPRIM) 100 MG tablet Take 1 tablet by mouth once daily 90 tablet 0   aspirin EC 81 MG tablet Take by mouth daily.     citalopram (CELEXA) 40 MG tablet Take 1 tablet by mouth once daily 90 tablet 0   clonazePAM (KLONOPIN) 0.5 MG tablet Take 1 tablet (0.5 mg total) by mouth daily as needed for anxiety. 90 tablet 1   colchicine 0.6 MG tablet Take by mouth as needed.      ezetimibe (ZETIA) 10 MG tablet Take 1 tablet by mouth once daily 90 tablet 1   fluticasone (FLONASE) 50 MCG/ACT nasal spray Place 2  sprays into both nostrils daily. 16 g 0   isosorbide mononitrate (IMDUR) 30 MG 24 hr tablet Take 1 tablet by mouth once daily 90 tablet 3   metFORMIN (GLUCOPHAGE-XR) 750 MG 24 hr tablet TAKE 2 TABLETS BY MOUTH ONCE DAILY WITH BREAKFAST 180 tablet 0   methocarbamol (ROBAXIN) 500 MG tablet Take 1 tablet (500 mg total) by mouth 4 (four) times daily. 30 tablet 0   metoprolol succinate (TOPROL-XL) 50 MG 24 hr tablet TAKE 1 TABLET BY MOUTH ONCE DAILY WITH  OR  IMMEDIATELY  FOLLOWING  A  MEAL 90 tablet 0   MOUNJARO 7.5 MG/0.5ML Pen INJECT 7.5 MG INTO THE SKIN  ONCE A WEEK 4 mL 0   nitroGLYCERIN (NITROSTAT) 0.4 MG SL tablet Place under the tongue.     rosuvastatin (CRESTOR) 40 MG tablet Take 1 tablet by mouth once daily 90 tablet 1   olopatadine (PATANOL) 0.1 % ophthalmic solution 1 drop as needed. (Patient not taking: Reported on 12/14/2022)     No current facility-administered medications for this visit.    No Known Allergies  Social History   Socioeconomic History   Marital status: Married    Spouse name: Not on file   Number of children: 2  Years of education: Not on file   Highest education level: Not on file  Occupational History   Occupation: Retired-Sold building supplies  Tobacco Use   Smoking status: Never   Smokeless tobacco: Never  Vaping Use   Vaping Use: Never used  Substance and Sexual Activity   Alcohol use: Never   Drug use: Never   Sexual activity: Not on file  Other Topics Concern   Not on file  Social History Narrative   Not on file   Social Determinants of Health   Financial Resource Strain: Low Risk  (06/08/2022)   Overall Financial Resource Strain (CARDIA)    Difficulty of Paying Living Expenses: Not hard at all  Food Insecurity: No Food Insecurity (06/08/2022)   Hunger Vital Sign    Worried About Running Out of Food in the Last Year: Never true    [place] in the Last Year: Never true  Transportation Needs: No Transportation Needs (06/08/2022)    PRAPARE - Hydrologist (Medical): No    Lack of Transportation (Non-Medical): No  Physical Activity: Sufficiently Active (06/08/2022)   Exercise Vital Sign    Days of Exercise per Week: 4 days    Minutes of Exercise per Session: 100 min  Stress: No Stress Concern Present (06/08/2022)   [place]    Feeling of Stress : Not at all  Social Connections: Moderately Integrated (06/08/2022)   Social Connection and Isolation Panel [NHANES]    Frequency of Communication with Friends and Family: More than three times a week    Frequency of Social Gatherings with Friends and Family: More than three times a week    Attends Religious Services: 1 to 4 times per year    Active Member of [employer] or Organizations: No    Attends Archivist Meetings: Never    Marital Status: Married  Human resources officer Violence: Not At Risk (06/08/2022)   Humiliation, Afraid, Rape, and Kick questionnaire    Fear of Current or Ex-Partner: No    Emotionally Abused: No    Physically Abused: No    Sexually Abused: No    Family History  Problem Relation Age of Onset   Cancer Mother    Heart attack Father 16   Diabetes Brother     Review of Systems:  As stated in the HPI and otherwise negative.   BP 126/76   Pulse 65   Ht 6\' 1"  (1.854 m)   Wt 108.2 kg   SpO2 98%   BMI 31.48 kg/m   Physical Examination: General: Well developed, well nourished, NAD  HEENT: OP clear, mucus membranes moist  SKIN: warm, dry. No rashes. Neuro: No focal deficits  Musculoskeletal: Muscle strength 5/5 all ext  Psychiatric: Mood and affect normal  Neck: No JVD, no carotid bruits, no thyromegaly, no lymphadenopathy.  Lungs:Clear bilaterally, no wheezes, rhonci, crackles Cardiovascular: Regular rate and rhythm. Soft systolic murmur.  Abdomen:Soft. Bowel sounds present. Non-tender.  Extremities: No lower extremity edema. Pulses are 2 + in  the bilateral DP/PT.  EKG:  EKG is not ordered today. The ekg ordered today demonstrates   Echo January 2023:  1. Left ventricular ejection fraction, by estimation, is 60 to 65%. The  left ventricle has normal function. The left ventricle has no regional  wall motion abnormalities. Left ventricular diastolic parameters are  indeterminate.   2. Right ventricular systolic function is normal. The right ventricular  size  is normal.   3. Left atrial size was mildly dilated.   4. The mitral valve is normal in structure. Mild mitral valve  regurgitation.   5. The aortic valve is normal in structure. Aortic valve regurgitation is  not visualized.   6. Aortic dilatation noted. There is mild dilatation of the ascending  aorta, measuring 43 mm.   7. The inferior vena cava is normal in size with greater than 50%  respiratory variability, suggesting right atrial pressure of 3 mmHg.   Recent Labs: 02/08/2022: Hemoglobin 13.7; Platelets 167.0; TSH 3.33 07/19/2022: ALT 20; BUN 15; Creatinine, Ser 1.15; Potassium 4.2; Sodium 142   Lipid Panel    Component Value Date/Time   CHOL 108 07/19/2022 0854   TRIG 91.0 07/19/2022 0854   HDL 35.00 (L) 07/19/2022 0854   CHOLHDL 3 07/19/2022 0854   VLDL 18.2 07/19/2022 0854   LDLCALC 55 07/19/2022 0854   LDLDIRECT 67.0 04/14/2020 1050     Wt Readings from Last 3 Encounters:  12/14/22 108.2 kg  10/04/22 108.9 kg  07/19/22 113.4 kg    Assessment and Plan:   1. CAD s/p CABG without angina: No chest pain. Nuclear stress test May 2020 at [facility] (see report in [place]) without ischemia. LVEF normal by echo February 2023. Will continue ASA, beta blocker, Imdur, statin and Zetia.    2. Thoracic aortic aneurysm: 3.9 cm ascending aorta.   3. HTN: BP is well controlled. No changes  4. Hyperlipidemia: LDL 55 in August 2023. Continue statin and Zetia.  5. Mitral regurgitation: Mild MR by echo in February 2023.    Labs/ tests ordered today  include:  No orders of the defined types were placed in this encounter.  Disposition:   FU with me in one year.   Signed, Lauree Chandler, MD 12/14/2022 11:08 AM    [facility] [place], [place], [place]  [address] Phone: [phone]; Fax: [phone]

## 2022-12-14 ENCOUNTER — Encounter: Payer: Self-pay | Admitting: Cardiovascular Disease

## 2022-12-14 ENCOUNTER — Ambulatory Visit: Payer: PPO | Attending: Cardiovascular Disease | Admitting: Cardiovascular Disease

## 2022-12-14 VITALS — BP 126/76 | HR 65 | Ht 73.0 in | Wt 238.6 lb

## 2022-12-14 DIAGNOSIS — I2510 Atherosclerotic heart disease of native coronary artery without angina pectoris: Secondary | ICD-10-CM | POA: Diagnosis not present

## 2022-12-14 DIAGNOSIS — I10 Essential (primary) hypertension: Secondary | ICD-10-CM

## 2022-12-14 DIAGNOSIS — I340 Nonrheumatic mitral (valve) insufficiency: Secondary | ICD-10-CM

## 2022-12-14 DIAGNOSIS — E785 Hyperlipidemia, unspecified: Secondary | ICD-10-CM | POA: Diagnosis not present

## 2022-12-14 NOTE — Patient Instructions (Signed)
Medication Instructions:  No changes *If you need a refill on your cardiac medications before your next appointment, please call your pharmacy*   Lab Work: none   Testing/Procedures: none   Follow-Up: At [facility], you and your health needs are our priority.  As part of our continuing mission to provide you with exceptional heart care, we have created designated Provider Care Teams.  These Care Teams include your primary Cardiologist (physician) and Advanced Practice Providers (APPs -  Physician Assistants and Nurse Practitioners) who all work together to provide you with the care you need, when you need it.   Your next appointment:   12 month(s)  Provider:   Christopher McAlhany, MD      

## 2022-12-20 ENCOUNTER — Telehealth: Payer: Self-pay | Admitting: Internal Medicine

## 2022-12-20 ENCOUNTER — Other Ambulatory Visit: Payer: Self-pay | Admitting: Internal Medicine

## 2022-12-20 DIAGNOSIS — M1A0790 Idiopathic chronic gout, unspecified ankle and foot, without tophus (tophi): Secondary | ICD-10-CM

## 2022-12-20 NOTE — Telephone Encounter (Signed)
Patient called and stated he had pain and swelling beneath his tongue. There was also pain in his throat and the back of his neck. We did not have anything available today and routed him to triage for further evaluation.

## 2022-12-21 ENCOUNTER — Ambulatory Visit: Payer: PPO | Admitting: Family Medicine

## 2022-12-21 ENCOUNTER — Other Ambulatory Visit (HOSPITAL_COMMUNITY): Payer: Self-pay

## 2022-12-21 NOTE — Telephone Encounter (Signed)
Please contact [employer]

## 2022-12-21 NOTE — Telephone Encounter (Signed)
Tried to submit PA in [facility] PA was cancelled, there is a previously denied request on file. [employer], they are faxing over an appeal form for additional information. Will complete once we receive it and fax back.

## 2022-12-25 ENCOUNTER — Other Ambulatory Visit (HOSPITAL_COMMUNITY): Payer: Self-pay

## 2022-12-25 NOTE — Telephone Encounter (Signed)
Patient Advocate Encounter  Appeal for [facility] 7.5/0.5 mL pen has been approved.    PA# [id number] Effective dates: 12/25/22 through 12/25/23

## 2022-12-25 NOTE — Telephone Encounter (Signed)
Appeal form faxed to [phone]

## 2022-12-28 ENCOUNTER — Telehealth: Payer: Self-pay | Admitting: Internal Medicine

## 2022-12-28 NOTE — Telephone Encounter (Signed)
Caller & Relationship to patient: Self  Call back number: [phone]   Date of last office visit: 8.24.23  Date of next office visit: 2.29.24  Medication(s) to be refilled:  MOUNJARO 7.5 MG/0.5ML Pen   Pt is requesting a higher dosage  Preferred Pharmacy:  [facility] [phone]   Phone: [phone]  Fax: [phone]

## 2022-12-29 ENCOUNTER — Other Ambulatory Visit: Payer: Self-pay | Admitting: Internal Medicine

## 2022-12-29 DIAGNOSIS — E119 Type 2 diabetes mellitus without complications: Secondary | ICD-10-CM

## 2022-12-29 MED ORDER — MOUNJARO 7.5 MG/0.5ML ~~LOC~~ SOAJ: 7.5000 mg | SUBCUTANEOUS | 0 refills | Status: DC

## 2022-12-31 ENCOUNTER — Other Ambulatory Visit: Payer: Self-pay | Admitting: Internal Medicine

## 2022-12-31 DIAGNOSIS — E119 Type 2 diabetes mellitus without complications: Secondary | ICD-10-CM

## 2023-01-23 DIAGNOSIS — L570 Actinic keratosis: Secondary | ICD-10-CM | POA: Diagnosis not present

## 2023-01-23 DIAGNOSIS — X32XXXD Exposure to sunlight, subsequent encounter: Secondary | ICD-10-CM | POA: Diagnosis not present

## 2023-01-23 DIAGNOSIS — D225 Melanocytic nevi of trunk: Secondary | ICD-10-CM | POA: Diagnosis not present

## 2023-01-23 DIAGNOSIS — L820 Inflamed seborrheic keratosis: Secondary | ICD-10-CM | POA: Diagnosis not present

## 2023-01-24 ENCOUNTER — Encounter: Payer: Self-pay | Admitting: Internal Medicine

## 2023-01-24 ENCOUNTER — Ambulatory Visit (INDEPENDENT_AMBULATORY_CARE_PROVIDER_SITE_OTHER): Payer: PPO | Admitting: Internal Medicine

## 2023-01-24 VITALS — BP 130/90 | HR 64 | Temp 97.6°F | Ht 73.0 in | Wt 232.0 lb

## 2023-01-24 DIAGNOSIS — I10 Essential (primary) hypertension: Secondary | ICD-10-CM

## 2023-01-24 DIAGNOSIS — E119 Type 2 diabetes mellitus without complications: Secondary | ICD-10-CM | POA: Diagnosis not present

## 2023-01-24 DIAGNOSIS — M1A0790 Idiopathic chronic gout, unspecified ankle and foot, without tophus (tophi): Secondary | ICD-10-CM

## 2023-01-24 DIAGNOSIS — I25118 Atherosclerotic heart disease of native coronary artery with other forms of angina pectoris: Secondary | ICD-10-CM

## 2023-01-24 DIAGNOSIS — Z125 Encounter for screening for malignant neoplasm of prostate: Secondary | ICD-10-CM | POA: Diagnosis not present

## 2023-01-24 DIAGNOSIS — R972 Elevated prostate specific antigen [PSA]: Secondary | ICD-10-CM

## 2023-01-24 LAB — BASIC METABOLIC PANEL
BUN: 18 mg/dL (ref 6–23)
CO2: 28 mEq/L (ref 19–32)
Calcium: 10.2 mg/dL (ref 8.4–10.5)
Chloride: 105 mEq/L (ref 96–112)
Creatinine, Ser: 1.12 mg/dL (ref 0.40–1.50)
GFR: 67.3 mL/min (ref >60.00)
Glucose, Bld: 87 mg/dL (ref 70–99)
Potassium: 4.4 mEq/L (ref 3.5–5.1)
Sodium: 141 mEq/L (ref 135–145)

## 2023-01-24 LAB — CBC WITH DIFFERENTIAL/PLATELET
Basophils Absolute: 0.1 10*3/uL (ref 0.0–0.1)
Basophils Relative: 1.2 % (ref 0.0–3.0)
Eosinophils Absolute: 0.6 10*3/uL (ref 0.0–0.7)
Eosinophils Relative: 8.3 % — ABNORMAL HIGH (ref 0.0–5.0)
HCT: 42.9 % (ref 39.0–52.0)
Hemoglobin: 14.3 g/dL (ref 13.0–17.0)
Lymphocytes Relative: 26.1 % (ref 12.0–46.0)
Lymphs Abs: 1.9 10*3/uL (ref 0.7–4.0)
MCHC: 33.4 g/dL (ref 30.0–36.0)
MCV: 89.2 fl (ref 78.0–100.0)
Monocytes Absolute: 0.5 10*3/uL (ref 0.1–1.0)
Monocytes Relative: 7.4 % (ref 3.0–12.0)
Neutro Abs: 4.2 10*3/uL (ref 1.4–7.7)
Neutrophils Relative %: 57 % (ref 43.0–77.0)
Platelets: 206 10*3/uL (ref 150.0–400.0)
RBC: 4.81 Mil/uL (ref 4.22–5.81)
RDW: 14.5 % (ref 11.5–15.5)
WBC: 7.3 10*3/uL (ref 4.0–10.5)

## 2023-01-24 LAB — URIC ACID: Uric Acid, Serum: 5.9 mg/dL (ref 4.0–7.8)

## 2023-01-24 LAB — PSA: PSA: 4.42 ng/mL — ABNORMAL HIGH (ref 0.10–4.00)

## 2023-01-24 LAB — HEPATIC FUNCTION PANEL
ALT: 17 U/L (ref 0–53)
AST: 19 U/L (ref 0–37)
Albumin: 3.9 g/dL (ref 3.5–5.2)
Alkaline Phosphatase: 50 U/L (ref 39–117)
Bilirubin, Direct: 0.1 mg/dL (ref 0.0–0.3)
Total Bilirubin: 0.5 mg/dL (ref 0.2–1.2)
Total Protein: 6.6 g/dL (ref 6.0–8.3)

## 2023-01-24 LAB — TSH: TSH: 3.04 u[IU]/mL (ref 0.35–5.50)

## 2023-01-24 LAB — HEMOGLOBIN A1C: Hgb A1c MFr Bld: 6.3 % (ref 4.6–6.5)

## 2023-01-24 MED ORDER — TIRZEPATIDE 10 MG/0.5ML ~~LOC~~ SOAJ: 10.0000 mg | SUBCUTANEOUS | 0 refills | Status: DC

## 2023-01-24 MED ORDER — OLMESARTAN MEDOXOMIL 20 MG PO TABS: 20.0000 mg | ORAL_TABLET | Freq: Every day | ORAL | 1 refills | Status: DC

## 2023-01-24 NOTE — Progress Notes (Signed)
Subjective:  Patient ID: Shane Garrett, male    DOB: 1956/01/21  Age: 67 y.o. MRN: [id number]  CC: Hypertension, Diabetes, and Hyperlipidemia   HPI Shane Garrett presents for f/up ----   He is active and denies chest pain, shortness of breath, diaphoresis, or edema.   Outpatient Medications Prior to Visit  Medication Sig Dispense Refill   allopurinol (ZYLOPRIM) 100 MG tablet Take 1 tablet by mouth once daily 90 tablet 0   aspirin EC 81 MG tablet Take by mouth daily.     citalopram (CELEXA) 40 MG tablet Take 1 tablet by mouth once daily 90 tablet 0   clonazePAM (KLONOPIN) 0.5 MG tablet Take 1 tablet (0.5 mg total) by mouth daily as needed for anxiety. 90 tablet 1   colchicine 0.6 MG tablet Take by mouth as needed.      ezetimibe (ZETIA) 10 MG tablet Take 1 tablet by mouth once daily 90 tablet 1   fluticasone (FLONASE) 50 MCG/ACT nasal spray Place 2 sprays into both nostrils daily. 16 g 0   isosorbide mononitrate (IMDUR) 30 MG 24 hr tablet Take 1 tablet by mouth once daily 90 tablet 3   metFORMIN (GLUCOPHAGE-XR) 750 MG 24 hr tablet TAKE 2 TABLETS BY MOUTH ONCE DAILY WITH BREAKFAST 180 tablet 0   methocarbamol (ROBAXIN) 500 MG tablet Take 1 tablet (500 mg total) by mouth 4 (four) times daily. 30 tablet 0   metoprolol succinate (TOPROL-XL) 50 MG 24 hr tablet TAKE 1 TABLET BY MOUTH ONCE DAILY WITH  OR  IMMEDIATELY  FOLLOWING  A  MEAL 90 tablet 0   nitroGLYCERIN (NITROSTAT) 0.4 MG SL tablet Place under the tongue.     rosuvastatin (CRESTOR) 40 MG tablet Take 1 tablet by mouth once daily 90 tablet 1   ramipril (ALTACE) 10 MG capsule Take 10 mg by mouth.     tirzepatide (MOUNJARO) 7.5 MG/0.5ML Pen Inject 7.5 mg into the skin once a week. 4 mL 0   olopatadine (PATANOL) 0.1 % ophthalmic solution 1 drop as needed. (Patient not taking: Reported on 12/14/2022)     No facility-administered medications prior to visit.    ROS Review of Systems  Constitutional: Negative.  Negative for  diaphoresis, fatigue and unexpected weight change.  HENT: Negative.    Eyes: Negative.   Respiratory:  Negative for chest tightness, shortness of breath and wheezing.   Cardiovascular:  Negative for chest pain, palpitations and leg swelling.  Gastrointestinal:  Negative for abdominal pain, constipation, diarrhea, nausea and vomiting.  Endocrine: Negative.   Genitourinary: Negative.  Negative for difficulty urinating.  Musculoskeletal:  Negative for arthralgias and myalgias.  Skin: Negative.  Negative for color change.  Neurological: Negative.  Negative for dizziness, weakness, light-headedness and numbness.  Hematological:  Negative for adenopathy. Does not bruise/bleed easily.  Psychiatric/Behavioral: Negative.      Objective:  BP (!) 130/90 (BP Location: Left Arm, Patient Position: Sitting, Cuff Size: Normal)   Pulse 64   Temp 97.6 F (36.4 C) (Oral)   Ht '6\' 1"'$  (1.854 m)   Wt 232 lb (105.2 kg)   SpO2 97%   BMI 30.61 kg/m   BP Readings from Last 3 Encounters:  01/24/23 (!) 130/90  12/14/22 126/76  10/04/22 112/82    Wt Readings from Last 3 Encounters:  01/24/23 232 lb (105.2 kg)  12/14/22 238 lb 9.6 oz (108.2 kg)  10/04/22 240 lb (108.9 kg)    Physical Exam Vitals reviewed.  Constitutional:  Appearance: He is not ill-appearing.  HENT:     Mouth/Throat:     Mouth: Mucous membranes are moist.  Eyes:     General: No scleral icterus.    Conjunctiva/sclera: Conjunctivae normal.  Cardiovascular:     Rate and Rhythm: Normal rate and regular rhythm.     Heart sounds: No murmur heard. Pulmonary:     Effort: Pulmonary effort is normal.     Breath sounds: No stridor. No wheezing, rhonchi or rales.  Abdominal:     General: Abdomen is flat.     Palpations: There is no mass.     Tenderness: There is no abdominal tenderness. There is no guarding.     Hernia: No hernia is present.  Musculoskeletal:        General: Normal range of motion.     Cervical back: Neck  supple.     Right lower leg: No edema.     Left lower leg: No edema.  Lymphadenopathy:     Cervical: No cervical adenopathy.  Skin:    General: Skin is warm and dry.  Neurological:     General: No focal deficit present.     Mental Status: He is alert. Mental status is at baseline.  Psychiatric:        Mood and Affect: Mood normal.        Behavior: Behavior normal.     Lab Results  Component Value Date   WBC 7.3 01/24/2023   HGB 14.3 01/24/2023   HCT 42.9 01/24/2023   PLT 206.0 01/24/2023   GLUCOSE 87 01/24/2023   CHOL 108 07/19/2022   TRIG 91.0 07/19/2022   HDL 35.00 (L) 07/19/2022   LDLDIRECT 67.0 04/14/2020   LDLCALC 55 07/19/2022   ALT 17 01/24/2023   AST 19 01/24/2023   NA 141 01/24/2023   K 4.4 01/24/2023   CL 105 01/24/2023   CREATININE 1.12 01/24/2023   BUN 18 01/24/2023   CO2 28 01/24/2023   TSH 3.04 01/24/2023   PSA 4.42 (H) 01/24/2023   HGBA1C 6.3 01/24/2023   MICROALBUR <0.7 07/19/2022    CT ANGIO CHEST AORTA W/ & OR WO/CM & GATING ([place] ONLY)  Result Date: 01/01/2022 CLINICAL DATA:  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 EXAM: CT ANGIOGRAPHY CHEST WITH CONTRAST TECHNIQUE: Multidetector CT imaging of the chest was performed using the standard protocol during bolus administration of intravenous contrast. Multiplanar CT image reconstructions and MIPs were obtained to evaluate the vascular anatomy. RADIATION DOSE REDUCTION: This exam was performed according to the departmental dose-optimization program which includes automated exposure control, adjustment of the mA and/or kV according to patient size and/or use of iterative reconstruction technique. CONTRAST:  73m OMNIPAQUE IOHEXOL 350 MG/ML SOLN COMPARISON:  Report from an echocardiogram dated 12/18/2021 FINDINGS: Cardiovascular: Caliber of the aortic root is top-normal measuring 4 cm at the level of the sinuses of Valsalva. The ascending thoracic aorta is top-normal  in caliber and not overtly aneurysmal measuring 3.9 cm in greatest estimated diameter. The thoracic aorta is moderately tortuous. The proximal arch measures 3.4 cm and the distal arch 2.8 cm. The descending thoracic aorta measures 2.5 cm. No evidence of aortic dissection. Proximal great vessels demonstrate normal patency and normal branching anatomy. There is evidence of prior CABG. The heart size is normal. No pericardial fluid identified. Central pulmonary arteries are normal in caliber. Mediastinum/Nodes: No enlarged mediastinal, hilar, or axillary lymph nodes. The trachea, and esophagus demonstrate no significant findings. Low-density  mid right thyroid nodule measures approximately 14 mm in greatest diameter. Lungs/Pleura: There is no evidence of pulmonary edema, consolidation, pneumothorax, nodule or pleural fluid. Upper Abdomen: No acute abnormality. Musculoskeletal: No chest wall abnormality. No acute or significant osseous findings. Review of the MIP images confirms the above findings. IMPRESSION: 1. No overt aneurysmal dilatation of the thoracic aorta. The aortic root and ascending thoracic aorta are top-normal in caliber with the root measuring 4 cm and the ascending thoracic aorta at 3.9 cm. The thoracic aorta is also moderately tortuous. 2. 1.4 cm right thyroid nodule. 1.4 cm incidental right thyroid nodule. No follow-up imaging is recommended. Reference: J Am Coll Radiol. 2015 Feb;12(2): 143-50 Electronically Signed   By: Aletta Edouard M.D.   On: 01/01/2022 09:33    Assessment & Plan:   Shane Garrett was seen today for hypertension, diabetes and hyperlipidemia.  Diagnoses and all orders for this visit:  Primary hypertension- He has not achieved his blood pressure goal of 130/80.  Will start an ARB. -     Basic metabolic panel; Future -     CBC with Differential/Platelet; Future -     Hepatic function panel; Future -     TSH; Future -     olmesartan (BENICAR) 20 MG tablet; Take 1 tablet (20 mg  total) by mouth daily. -     TSH -     Hepatic function panel -     CBC with Differential/Platelet -     Basic metabolic panel  Coronary artery disease of native artery of native heart with stable angina pectoris (HCC) -     olmesartan (BENICAR) 20 MG tablet; Take 1 tablet (20 mg total) by mouth daily.  Screening for prostate cancer -     PSA; Future -     PSA  Idiopathic chronic gout of foot without tophus, unspecified laterality- He has achieved his uric acid goal. -     Uric acid; Future -     Uric acid  Type 2 diabetes mellitus without complication, without long-term current use of insulin ([place])- His blood sugar is well-controlled. -     Hemoglobin A1c; Future -     olmesartan (BENICAR) 20 MG tablet; Take 1 tablet (20 mg total) by mouth daily. -     HM Diabetes Foot Exam -     Hemoglobin A1c -     tirzepatide (MOUNJARO) 10 MG/0.5ML Pen; Inject 10 mg into the skin once a week.  Elevated PSA- Will recheck this in 3 months.   I have discontinued Shane Garrett "Steve"'s Mounjaro and ramipril. I am also having him start on olmesartan and tirzepatide. Additionally, I am having him maintain his aspirin EC, nitroGLYCERIN, olopatadine, colchicine, fluticasone, isosorbide mononitrate, clonazePAM, methocarbamol, ezetimibe, rosuvastatin, metoprolol succinate, citalopram, allopurinol, and metFORMIN.  Meds ordered this encounter  Medications   olmesartan (BENICAR) 20 MG tablet    Sig: Take 1 tablet (20 mg total) by mouth daily.    Dispense:  90 tablet    Refill:  1   tirzepatide (MOUNJARO) 10 MG/0.5ML Pen    Sig: Inject 10 mg into the skin once a week.    Dispense:  6 mL    Refill:  0     Follow-up: Return in about 6 months (around 07/25/2023).  Scarlette Calico, MD

## 2023-01-24 NOTE — Patient Instructions (Signed)
Hypertension, Adult High blood pressure (hypertension) is when the force of blood pumping through the arteries is too strong. The arteries are the blood vessels that carry blood from the heart throughout the body. Hypertension forces the heart to work harder to pump blood and may cause arteries to become narrow or stiff. Untreated or uncontrolled hypertension can lead to a heart attack, heart failure, a stroke, kidney disease, and other problems. A blood pressure reading consists of a higher number over a lower number. Ideally, your blood pressure should be below 120/80. The first ("top") number is called the systolic pressure. It is a measure of the pressure in your arteries as your heart beats. The second ("bottom") number is called the diastolic pressure. It is a measure of the pressure in your arteries as the heart relaxes. What are the causes? The exact cause of this condition is not known. There are some conditions that result in high blood pressure. What increases the risk? Certain factors may make you more likely to develop high blood pressure. Some of these risk factors are under your control, including: Smoking. Not getting enough exercise or physical activity. Being overweight. Having too much fat, sugar, calories, or salt (sodium) in your diet. Drinking too much alcohol. Other risk factors include: Having a personal history of heart disease, diabetes, high cholesterol, or kidney disease. Stress. Having a family history of high blood pressure and high cholesterol. Having obstructive sleep apnea. Age. The risk increases with age. What are the signs or symptoms? High blood pressure may not cause symptoms. Very high blood pressure (hypertensive crisis) may cause: Headache. Fast or irregular heartbeats (palpitations). Shortness of breath. Nosebleed. Nausea and vomiting. Vision changes. Severe chest pain, dizziness, and seizures. How is this diagnosed? This condition is diagnosed by  measuring your blood pressure while you are seated, with your arm resting on a flat surface, your legs uncrossed, and your feet flat on the floor. The cuff of the blood pressure monitor will be placed directly against the skin of your upper arm at the level of your heart. Blood pressure should be measured at least twice using the same arm. Certain conditions can cause a difference in blood pressure between your right and left arms. If you have a high blood pressure reading during one visit or you have normal blood pressure with other risk factors, you may be asked to: Return on a different day to have your blood pressure checked again. Monitor your blood pressure at home for 1 week or longer. If you are diagnosed with hypertension, you may have other blood or imaging tests to help your health care provider understand your overall risk for other conditions. How is this treated? This condition is treated by making healthy lifestyle changes, such as eating healthy foods, exercising more, and reducing your alcohol intake. You may be referred for counseling on a healthy diet and physical activity. Your health care provider may prescribe medicine if lifestyle changes are not enough to get your blood pressure under control and if: Your systolic blood pressure is above 130. Your diastolic blood pressure is above 80. Your personal target blood pressure may vary depending on your medical conditions, your age, and other factors. Follow these instructions at home: Eating and drinking  Eat a diet that is high in fiber and potassium, and low in sodium, added sugar, and fat. An example of this eating plan is called the DASH diet. DASH stands for Dietary Approaches to Stop Hypertension. To eat this way: Eat   plenty of fresh fruits and vegetables. Try to fill one half of your plate at each meal with fruits and vegetables. Eat whole grains, such as whole-wheat pasta, brown rice, or whole-grain bread. Fill about one  fourth of your plate with whole grains. Eat or drink low-fat dairy products, such as skim milk or low-fat yogurt. Avoid fatty cuts of meat, processed or cured meats, and poultry with skin. Fill about one fourth of your plate with lean proteins, such as fish, chicken without skin, beans, eggs, or tofu. Avoid pre-made and processed foods. These tend to be higher in sodium, added sugar, and fat. Reduce your daily sodium intake. Many people with hypertension should eat less than 1,500 mg of sodium a day. Do not drink alcohol if: Your health care provider tells you not to drink. You are pregnant, may be pregnant, or are planning to become pregnant. If you drink alcohol: Limit how much you have to: 0-1 drink a day for women. 0-2 drinks a day for men. Know how much alcohol is in your drink. In the [place] one drink equals one 12 oz bottle of beer (355 mL), one 5 oz glass of wine (148 mL), or one 1 oz glass of hard liquor (44 mL). Lifestyle  Work with your health care provider to maintain a healthy body weight or to lose weight. Ask what an ideal weight is for you. Get at least 30 minutes of exercise that causes your heart to beat faster (aerobic exercise) most days of the week. Activities may include walking, swimming, or biking. Include exercise to strengthen your muscles (resistance exercise), such as Pilates or lifting weights, as part of your weekly exercise routine. Try to do these types of exercises for 30 minutes at least 3 days a week. Do not use any products that contain nicotine or tobacco. These products include cigarettes, chewing tobacco, and vaping devices, such as e-cigarettes. If you need help quitting, ask your health care provider. Monitor your blood pressure at home as told by your health care provider. Keep all follow-up visits. This is important. Medicines Take over-the-counter and prescription medicines only as told by your health care provider. Follow directions carefully. Blood  pressure medicines must be taken as prescribed. Do not skip doses of blood pressure medicine. Doing this puts you at risk for problems and can make the medicine less effective. Ask your health care provider about side effects or reactions to medicines that you should watch for. Contact a health care provider if you: Think you are having a reaction to a medicine you are taking. Have headaches that keep coming back (recurring). Feel dizzy. Have swelling in your ankles. Have trouble with your vision. Get help right away if you: Develop a severe headache or confusion. Have unusual weakness or numbness. Feel faint. Have severe pain in your chest or abdomen. Vomit repeatedly. Have trouble breathing. These symptoms may be an emergency. Get help right away. Call 911. Do not wait to see if the symptoms will go away. Do not drive yourself to the hospital. Summary Hypertension is when the force of blood pumping through your arteries is too strong. If this condition is not controlled, it may put you at risk for serious complications. Your personal target blood pressure may vary depending on your medical conditions, your age, and other factors. For most people, a normal blood pressure is less than 120/80. Hypertension is treated with lifestyle changes, medicines, or a combination of both. Lifestyle changes include losing weight, eating a healthy,   low-sodium diet, exercising more, and limiting alcohol. This information is not intended to replace advice given to you by your health care provider. Make sure you discuss any questions you have with your health care provider. Document Revised: 09/19/2021 Document Reviewed: 09/19/2021 Elsevier Patient Education  2023 Elsevier Inc.  

## 2023-01-28 ENCOUNTER — Other Ambulatory Visit: Payer: Self-pay | Admitting: Internal Medicine

## 2023-01-28 ENCOUNTER — Telehealth: Payer: Self-pay | Admitting: Internal Medicine

## 2023-01-28 ENCOUNTER — Telehealth: Payer: Self-pay

## 2023-01-28 DIAGNOSIS — E119 Type 2 diabetes mellitus without complications: Secondary | ICD-10-CM

## 2023-01-28 DIAGNOSIS — R972 Elevated prostate specific antigen [PSA]: Secondary | ICD-10-CM

## 2023-01-28 NOTE — Telephone Encounter (Signed)
Key: [id number]

## 2023-01-28 NOTE — Telephone Encounter (Signed)
Patient Advocate Encounter  Prior Authorization for Mounjaro '10MG'$ /0.5ML pen-injectors has been approved through [employer].    Key: [id number]  Effective: 01-28-2023 to 01-28-2024

## 2023-01-28 NOTE — Telephone Encounter (Signed)
Patient called and said that per your recommendation he needs a referral to a urologist.  Please call:  [phone]

## 2023-02-01 ENCOUNTER — Other Ambulatory Visit: Payer: Self-pay | Admitting: Internal Medicine

## 2023-02-01 ENCOUNTER — Telehealth: Payer: Self-pay | Admitting: Internal Medicine

## 2023-02-01 DIAGNOSIS — E119 Type 2 diabetes mellitus without complications: Secondary | ICD-10-CM

## 2023-02-01 DIAGNOSIS — I25118 Atherosclerotic heart disease of native coronary artery with other forms of angina pectoris: Secondary | ICD-10-CM

## 2023-02-01 MED ORDER — SEMAGLUTIDE (2 MG/DOSE) 8 MG/3ML ~~LOC~~ SOPN: 2.0000 mg | PEN_INJECTOR | SUBCUTANEOUS | 1 refills | Status: DC

## 2023-02-01 MED ORDER — INSULIN PEN NEEDLE 32G X 6 MM MISC: 1.0000 | 1 refills | Status: AC

## 2023-02-01 NOTE — Telephone Encounter (Signed)
Patient called and said Shane Garrett is on backorder. He would like to know if he can be switched to Ozempic instead. Best callback number is [phone].

## 2023-02-11 ENCOUNTER — Other Ambulatory Visit: Payer: Self-pay | Admitting: Internal Medicine

## 2023-02-11 DIAGNOSIS — I25118 Atherosclerotic heart disease of native coronary artery with other forms of angina pectoris: Secondary | ICD-10-CM

## 2023-02-11 DIAGNOSIS — E785 Hyperlipidemia, unspecified: Secondary | ICD-10-CM

## 2023-02-20 ENCOUNTER — Other Ambulatory Visit: Payer: Self-pay | Admitting: Internal Medicine

## 2023-02-20 DIAGNOSIS — I10 Essential (primary) hypertension: Secondary | ICD-10-CM

## 2023-03-09 ENCOUNTER — Other Ambulatory Visit: Payer: Self-pay | Admitting: Internal Medicine

## 2023-03-09 DIAGNOSIS — F325 Major depressive disorder, single episode, in full remission: Secondary | ICD-10-CM

## 2023-03-15 ENCOUNTER — Other Ambulatory Visit: Payer: Self-pay | Admitting: Internal Medicine

## 2023-03-15 DIAGNOSIS — M1A0790 Idiopathic chronic gout, unspecified ankle and foot, without tophus (tophi): Secondary | ICD-10-CM

## 2023-03-18 DIAGNOSIS — N401 Enlarged prostate with lower urinary tract symptoms: Secondary | ICD-10-CM | POA: Diagnosis not present

## 2023-03-18 DIAGNOSIS — R972 Elevated prostate specific antigen [PSA]: Secondary | ICD-10-CM | POA: Diagnosis not present

## 2023-03-18 DIAGNOSIS — R3912 Poor urinary stream: Secondary | ICD-10-CM | POA: Diagnosis not present

## 2023-03-26 ENCOUNTER — Telehealth: Payer: Self-pay | Admitting: Internal Medicine

## 2023-03-26 ENCOUNTER — Other Ambulatory Visit: Payer: Self-pay | Admitting: Internal Medicine

## 2023-03-26 DIAGNOSIS — E119 Type 2 diabetes mellitus without complications: Secondary | ICD-10-CM

## 2023-03-26 NOTE — Telephone Encounter (Signed)
Called patient to schedule Medicare Annual Wellness Visit (AWV). Left message for patient to call back and schedule Medicare Annual Wellness Visit (AWV).  Last date of AWV: 06/08/2022  Please schedule an appointment at any time with [facility] .  If any questions, please contact me at [phone].  Thank you ,  Randon Goldsmith Care Guide [facility] AWV TEAM Direct Dial: [phone]

## 2023-04-03 ENCOUNTER — Telehealth: Payer: Self-pay | Admitting: Internal Medicine

## 2023-04-03 NOTE — Telephone Encounter (Signed)
Called patient to schedule Medicare Annual Wellness Visit (AWV). Left message for patient to call back and schedule Medicare Annual Wellness Visit (AWV).  Last date of AWV: 06/08/2022  Please schedule an appointment at any time with [facility] .  If any questions, please contact me at [phone].  Thank you ,  Bernice Cicero Care Guide [facility] AWV TEAM Direct Dial: [phone]    

## 2023-04-05 ENCOUNTER — Other Ambulatory Visit: Payer: Self-pay | Admitting: Physician Assistant

## 2023-04-29 ENCOUNTER — Encounter: Payer: Self-pay | Admitting: Internal Medicine

## 2023-05-15 ENCOUNTER — Other Ambulatory Visit: Payer: Self-pay | Admitting: Internal Medicine

## 2023-05-15 DIAGNOSIS — I25118 Atherosclerotic heart disease of native coronary artery with other forms of angina pectoris: Secondary | ICD-10-CM

## 2023-05-16 ENCOUNTER — Other Ambulatory Visit: Payer: Self-pay | Admitting: Internal Medicine

## 2023-05-16 DIAGNOSIS — I10 Essential (primary) hypertension: Secondary | ICD-10-CM

## 2023-06-02 ENCOUNTER — Other Ambulatory Visit: Payer: Self-pay | Admitting: Internal Medicine

## 2023-06-02 DIAGNOSIS — E785 Hyperlipidemia, unspecified: Secondary | ICD-10-CM

## 2023-06-02 DIAGNOSIS — I25118 Atherosclerotic heart disease of native coronary artery with other forms of angina pectoris: Secondary | ICD-10-CM

## 2023-06-11 ENCOUNTER — Other Ambulatory Visit: Payer: Self-pay | Admitting: Internal Medicine

## 2023-06-11 DIAGNOSIS — F325 Major depressive disorder, single episode, in full remission: Secondary | ICD-10-CM

## 2023-06-11 DIAGNOSIS — M1A0790 Idiopathic chronic gout, unspecified ankle and foot, without tophus (tophi): Secondary | ICD-10-CM

## 2023-06-22 ENCOUNTER — Other Ambulatory Visit: Payer: Self-pay | Admitting: Internal Medicine

## 2023-06-22 DIAGNOSIS — E119 Type 2 diabetes mellitus without complications: Secondary | ICD-10-CM

## 2023-06-26 ENCOUNTER — Other Ambulatory Visit: Payer: Self-pay | Admitting: Internal Medicine

## 2023-06-26 DIAGNOSIS — E119 Type 2 diabetes mellitus without complications: Secondary | ICD-10-CM

## 2023-06-26 DIAGNOSIS — I25118 Atherosclerotic heart disease of native coronary artery with other forms of angina pectoris: Secondary | ICD-10-CM

## 2023-07-01 DIAGNOSIS — S61200A Unspecified open wound of right index finger without damage to nail, initial encounter: Secondary | ICD-10-CM | POA: Diagnosis not present

## 2023-07-03 DIAGNOSIS — R3912 Poor urinary stream: Secondary | ICD-10-CM | POA: Diagnosis not present

## 2023-07-03 DIAGNOSIS — N401 Enlarged prostate with lower urinary tract symptoms: Secondary | ICD-10-CM | POA: Diagnosis not present

## 2023-07-03 DIAGNOSIS — R972 Elevated prostate specific antigen [PSA]: Secondary | ICD-10-CM | POA: Diagnosis not present

## 2023-07-15 DIAGNOSIS — S61200D Unspecified open wound of right index finger without damage to nail, subsequent encounter: Secondary | ICD-10-CM | POA: Diagnosis not present

## 2023-07-15 DIAGNOSIS — Z4802 Encounter for removal of sutures: Secondary | ICD-10-CM | POA: Diagnosis not present

## 2023-08-09 ENCOUNTER — Other Ambulatory Visit: Payer: Self-pay | Admitting: Internal Medicine

## 2023-08-09 DIAGNOSIS — I25118 Atherosclerotic heart disease of native coronary artery with other forms of angina pectoris: Secondary | ICD-10-CM

## 2023-08-12 DIAGNOSIS — M4317 Spondylolisthesis, lumbosacral region: Secondary | ICD-10-CM | POA: Diagnosis not present

## 2023-08-12 DIAGNOSIS — M9905 Segmental and somatic dysfunction of pelvic region: Secondary | ICD-10-CM | POA: Diagnosis not present

## 2023-08-12 DIAGNOSIS — M5137 Other intervertebral disc degeneration, lumbosacral region: Secondary | ICD-10-CM | POA: Diagnosis not present

## 2023-08-12 DIAGNOSIS — M5431 Sciatica, right side: Secondary | ICD-10-CM | POA: Diagnosis not present

## 2023-08-12 DIAGNOSIS — M9903 Segmental and somatic dysfunction of lumbar region: Secondary | ICD-10-CM | POA: Diagnosis not present

## 2023-08-12 DIAGNOSIS — Q72891 Other reduction defects of right lower limb: Secondary | ICD-10-CM | POA: Diagnosis not present

## 2023-08-12 DIAGNOSIS — M9904 Segmental and somatic dysfunction of sacral region: Secondary | ICD-10-CM | POA: Diagnosis not present

## 2023-08-13 ENCOUNTER — Other Ambulatory Visit: Payer: Self-pay | Admitting: Internal Medicine

## 2023-08-13 DIAGNOSIS — M9903 Segmental and somatic dysfunction of lumbar region: Secondary | ICD-10-CM | POA: Diagnosis not present

## 2023-08-13 DIAGNOSIS — Q72891 Other reduction defects of right lower limb: Secondary | ICD-10-CM | POA: Diagnosis not present

## 2023-08-13 DIAGNOSIS — M9904 Segmental and somatic dysfunction of sacral region: Secondary | ICD-10-CM | POA: Diagnosis not present

## 2023-08-13 DIAGNOSIS — M5137 Other intervertebral disc degeneration, lumbosacral region: Secondary | ICD-10-CM | POA: Diagnosis not present

## 2023-08-13 DIAGNOSIS — M5431 Sciatica, right side: Secondary | ICD-10-CM | POA: Diagnosis not present

## 2023-08-13 DIAGNOSIS — M4317 Spondylolisthesis, lumbosacral region: Secondary | ICD-10-CM | POA: Diagnosis not present

## 2023-08-13 DIAGNOSIS — I25118 Atherosclerotic heart disease of native coronary artery with other forms of angina pectoris: Secondary | ICD-10-CM

## 2023-08-13 DIAGNOSIS — M9905 Segmental and somatic dysfunction of pelvic region: Secondary | ICD-10-CM | POA: Diagnosis not present

## 2023-08-15 DIAGNOSIS — M4317 Spondylolisthesis, lumbosacral region: Secondary | ICD-10-CM | POA: Diagnosis not present

## 2023-08-15 DIAGNOSIS — Q72891 Other reduction defects of right lower limb: Secondary | ICD-10-CM | POA: Diagnosis not present

## 2023-08-15 DIAGNOSIS — M9904 Segmental and somatic dysfunction of sacral region: Secondary | ICD-10-CM | POA: Diagnosis not present

## 2023-08-15 DIAGNOSIS — M5431 Sciatica, right side: Secondary | ICD-10-CM | POA: Diagnosis not present

## 2023-08-15 DIAGNOSIS — M9903 Segmental and somatic dysfunction of lumbar region: Secondary | ICD-10-CM | POA: Diagnosis not present

## 2023-08-15 DIAGNOSIS — M9905 Segmental and somatic dysfunction of pelvic region: Secondary | ICD-10-CM | POA: Diagnosis not present

## 2023-08-15 DIAGNOSIS — M5137 Other intervertebral disc degeneration, lumbosacral region: Secondary | ICD-10-CM | POA: Diagnosis not present

## 2023-08-19 DIAGNOSIS — M5137 Other intervertebral disc degeneration, lumbosacral region: Secondary | ICD-10-CM | POA: Diagnosis not present

## 2023-08-19 DIAGNOSIS — M9905 Segmental and somatic dysfunction of pelvic region: Secondary | ICD-10-CM | POA: Diagnosis not present

## 2023-08-19 DIAGNOSIS — Q72891 Other reduction defects of right lower limb: Secondary | ICD-10-CM | POA: Diagnosis not present

## 2023-08-19 DIAGNOSIS — M5431 Sciatica, right side: Secondary | ICD-10-CM | POA: Diagnosis not present

## 2023-08-19 DIAGNOSIS — M4317 Spondylolisthesis, lumbosacral region: Secondary | ICD-10-CM | POA: Diagnosis not present

## 2023-08-19 DIAGNOSIS — M9903 Segmental and somatic dysfunction of lumbar region: Secondary | ICD-10-CM | POA: Diagnosis not present

## 2023-08-19 DIAGNOSIS — M9904 Segmental and somatic dysfunction of sacral region: Secondary | ICD-10-CM | POA: Diagnosis not present

## 2023-08-21 DIAGNOSIS — M5431 Sciatica, right side: Secondary | ICD-10-CM | POA: Diagnosis not present

## 2023-08-21 DIAGNOSIS — M9903 Segmental and somatic dysfunction of lumbar region: Secondary | ICD-10-CM | POA: Diagnosis not present

## 2023-08-21 DIAGNOSIS — M4317 Spondylolisthesis, lumbosacral region: Secondary | ICD-10-CM | POA: Diagnosis not present

## 2023-08-21 DIAGNOSIS — Q72891 Other reduction defects of right lower limb: Secondary | ICD-10-CM | POA: Diagnosis not present

## 2023-08-21 DIAGNOSIS — M5137 Other intervertebral disc degeneration, lumbosacral region: Secondary | ICD-10-CM | POA: Diagnosis not present

## 2023-08-21 DIAGNOSIS — M9905 Segmental and somatic dysfunction of pelvic region: Secondary | ICD-10-CM | POA: Diagnosis not present

## 2023-08-21 DIAGNOSIS — M9904 Segmental and somatic dysfunction of sacral region: Secondary | ICD-10-CM | POA: Diagnosis not present

## 2023-08-22 DIAGNOSIS — M9904 Segmental and somatic dysfunction of sacral region: Secondary | ICD-10-CM | POA: Diagnosis not present

## 2023-08-22 DIAGNOSIS — Q72891 Other reduction defects of right lower limb: Secondary | ICD-10-CM | POA: Diagnosis not present

## 2023-08-22 DIAGNOSIS — M9905 Segmental and somatic dysfunction of pelvic region: Secondary | ICD-10-CM | POA: Diagnosis not present

## 2023-08-22 DIAGNOSIS — M5431 Sciatica, right side: Secondary | ICD-10-CM | POA: Diagnosis not present

## 2023-08-22 DIAGNOSIS — M4317 Spondylolisthesis, lumbosacral region: Secondary | ICD-10-CM | POA: Diagnosis not present

## 2023-08-22 DIAGNOSIS — M9903 Segmental and somatic dysfunction of lumbar region: Secondary | ICD-10-CM | POA: Diagnosis not present

## 2023-08-22 DIAGNOSIS — M5137 Other intervertebral disc degeneration, lumbosacral region: Secondary | ICD-10-CM | POA: Diagnosis not present

## 2023-08-23 ENCOUNTER — Other Ambulatory Visit: Payer: Self-pay | Admitting: Internal Medicine

## 2023-08-23 DIAGNOSIS — I10 Essential (primary) hypertension: Secondary | ICD-10-CM

## 2023-08-26 ENCOUNTER — Other Ambulatory Visit: Payer: Self-pay | Admitting: Internal Medicine

## 2023-08-26 DIAGNOSIS — E785 Hyperlipidemia, unspecified: Secondary | ICD-10-CM

## 2023-08-26 DIAGNOSIS — M9905 Segmental and somatic dysfunction of pelvic region: Secondary | ICD-10-CM | POA: Diagnosis not present

## 2023-08-26 DIAGNOSIS — M4317 Spondylolisthesis, lumbosacral region: Secondary | ICD-10-CM | POA: Diagnosis not present

## 2023-08-26 DIAGNOSIS — M9904 Segmental and somatic dysfunction of sacral region: Secondary | ICD-10-CM | POA: Diagnosis not present

## 2023-08-26 DIAGNOSIS — Q72891 Other reduction defects of right lower limb: Secondary | ICD-10-CM | POA: Diagnosis not present

## 2023-08-26 DIAGNOSIS — M5137 Other intervertebral disc degeneration, lumbosacral region: Secondary | ICD-10-CM | POA: Diagnosis not present

## 2023-08-26 DIAGNOSIS — I25118 Atherosclerotic heart disease of native coronary artery with other forms of angina pectoris: Secondary | ICD-10-CM

## 2023-08-26 DIAGNOSIS — M5431 Sciatica, right side: Secondary | ICD-10-CM | POA: Diagnosis not present

## 2023-08-26 DIAGNOSIS — M9903 Segmental and somatic dysfunction of lumbar region: Secondary | ICD-10-CM | POA: Diagnosis not present

## 2023-08-28 DIAGNOSIS — M51372 Other intervertebral disc degeneration, lumbosacral region with discogenic back pain and lower extremity pain: Secondary | ICD-10-CM | POA: Diagnosis not present

## 2023-08-28 DIAGNOSIS — M4317 Spondylolisthesis, lumbosacral region: Secondary | ICD-10-CM | POA: Diagnosis not present

## 2023-08-28 DIAGNOSIS — M9904 Segmental and somatic dysfunction of sacral region: Secondary | ICD-10-CM | POA: Diagnosis not present

## 2023-08-28 DIAGNOSIS — Q72891 Other reduction defects of right lower limb: Secondary | ICD-10-CM | POA: Diagnosis not present

## 2023-08-28 DIAGNOSIS — M5431 Sciatica, right side: Secondary | ICD-10-CM | POA: Diagnosis not present

## 2023-08-28 DIAGNOSIS — M9903 Segmental and somatic dysfunction of lumbar region: Secondary | ICD-10-CM | POA: Diagnosis not present

## 2023-08-28 DIAGNOSIS — M9905 Segmental and somatic dysfunction of pelvic region: Secondary | ICD-10-CM | POA: Diagnosis not present

## 2023-08-29 DIAGNOSIS — M5431 Sciatica, right side: Secondary | ICD-10-CM | POA: Diagnosis not present

## 2023-08-29 DIAGNOSIS — M51372 Other intervertebral disc degeneration, lumbosacral region with discogenic back pain and lower extremity pain: Secondary | ICD-10-CM | POA: Diagnosis not present

## 2023-08-29 DIAGNOSIS — M4317 Spondylolisthesis, lumbosacral region: Secondary | ICD-10-CM | POA: Diagnosis not present

## 2023-08-29 DIAGNOSIS — M9903 Segmental and somatic dysfunction of lumbar region: Secondary | ICD-10-CM | POA: Diagnosis not present

## 2023-08-29 DIAGNOSIS — M9905 Segmental and somatic dysfunction of pelvic region: Secondary | ICD-10-CM | POA: Diagnosis not present

## 2023-08-29 DIAGNOSIS — Q72891 Other reduction defects of right lower limb: Secondary | ICD-10-CM | POA: Diagnosis not present

## 2023-08-29 DIAGNOSIS — M9904 Segmental and somatic dysfunction of sacral region: Secondary | ICD-10-CM | POA: Diagnosis not present

## 2023-09-10 ENCOUNTER — Other Ambulatory Visit: Payer: Self-pay | Admitting: Internal Medicine

## 2023-09-10 DIAGNOSIS — F325 Major depressive disorder, single episode, in full remission: Secondary | ICD-10-CM

## 2023-09-10 DIAGNOSIS — M1A0790 Idiopathic chronic gout, unspecified ankle and foot, without tophus (tophi): Secondary | ICD-10-CM

## 2023-09-12 ENCOUNTER — Ambulatory Visit (INDEPENDENT_AMBULATORY_CARE_PROVIDER_SITE_OTHER): Payer: PPO

## 2023-09-12 VITALS — Ht 73.0 in | Wt 220.0 lb

## 2023-09-12 DIAGNOSIS — Z0000 Encounter for general adult medical examination without abnormal findings: Secondary | ICD-10-CM

## 2023-09-12 NOTE — Patient Instructions (Signed)
Shane Garrett , Thank you for taking time to come for your Medicare Wellness Visit. I appreciate your ongoing commitment to your health goals. Please review the following plan we discussed and let me know if I can assist you in the future.   Referrals/Orders/Follow-Ups/Clinician Recommendations: Keep up the good work.  This is a list of the screening recommended for you and due dates:  Health Maintenance  Topic Date Due   Flu Shot  Never done   Yearly kidney health urinalysis for diabetes  07/20/2023   Hemoglobin A1C  07/25/2023   Eye exam for diabetics  10/23/2023*   Yearly kidney function blood test for diabetes  01/24/2024   Complete foot exam   01/24/2024   Medicare Annual Wellness Visit  09/11/2024   Colon Cancer Screening  03/09/2027   DTaP/Tdap/Td vaccine (2 - Td or Tdap) 07/13/2031   Pneumonia Vaccine  Completed   Hepatitis C Screening  Completed   Zoster (Shingles) Vaccine  Completed   HPV Vaccine  Aged Out   COVID-19 Vaccine  Discontinued  *Topic was postponed. The date shown is not the original due date.    Advanced directives: (Copy Requested) Please bring a copy of your health care power of attorney and living will to the office to be added to your chart at your convenience.  Next Medicare Annual Wellness Visit scheduled for next year: Yes

## 2023-09-12 NOTE — Progress Notes (Signed)
Subjective:   Shane Garrett is a 67 y.o. male who presents for Medicare Annual/Subsequent preventive examination.  Visit Complete: Virtual I connected with  Shane Garrett on 09/12/23 by a audio enabled telemedicine application and verified that I am speaking with the correct person using two identifiers.  Patient Location: Home  Provider Location: Office/Clinic  I discussed the limitations of evaluation and management by telemedicine. The patient expressed understanding and agreed to proceed.  Vital Signs: Because this visit was a virtual/telehealth visit, some criteria may be missing or patient reported. Any vitals not documented were not able to be obtained and vitals that have been documented are patient reported.   Cardiac Risk Factors include: advanced age (>26men, >77 women);diabetes mellitus     Objective:    Today's Vitals   09/12/23 1518  Weight: 220 lb (99.8 kg)  Height: 6\' 1"  (1.854 m)   Body mass index is 29.03 kg/m.     09/12/2023    3:23 PM 06/08/2022    9:00 AM  Advanced Directives  Does Patient Have a Medical Advance Directive? Yes Yes  Type of Estate agent of [place];Living will Living will  Copy of Healthcare Power of Attorney in Chart? No - copy requested     Current Medications (verified) Outpatient Encounter Medications as of 09/12/2023  Medication Sig   allopurinol (ZYLOPRIM) 100 MG tablet Take 1 tablet by mouth once daily   aspirin EC 81 MG tablet Take by mouth daily.   citalopram (CELEXA) 40 MG tablet Take 1 tablet by mouth once daily   clonazePAM (KLONOPIN) 0.5 MG tablet Take 1 tablet (0.5 mg total) by mouth daily as needed for anxiety.   colchicine 0.6 MG tablet Take by mouth as needed.    ezetimibe (ZETIA) 10 MG tablet Take 1 tablet by mouth once daily   fluticasone (FLONASE) 50 MCG/ACT nasal spray Place 2 sprays into both nostrils daily.   Insulin Pen Needle 32G X 6 MM MISC 1 Act by Does not apply route once a  week.   isosorbide mononitrate (IMDUR) 30 MG 24 hr tablet Take 1 tablet by mouth once daily   metFORMIN (GLUCOPHAGE-XR) 750 MG 24 hr tablet TAKE 2 TABLETS BY MOUTH ONCE DAILY WITH BREAKFAST   methocarbamol (ROBAXIN) 500 MG tablet Take 1 tablet (500 mg total) by mouth 4 (four) times daily.   metoprolol succinate (TOPROL-XL) 50 MG 24 hr tablet TAKE 1 TABLET BY MOUTH ONCE DAILY WITH MEALS   nitroGLYCERIN (NITROSTAT) 0.4 MG SL tablet Place under the tongue.   olmesartan (BENICAR) 20 MG tablet Take 1 tablet (20 mg total) by mouth daily.   olopatadine (PATANOL) 0.1 % ophthalmic solution    OZEMPIC, 2 MG/DOSE, 8 MG/3ML SOPN INJECT 2 MG SUBCUTANEOUSLY ONCE A WEEK AS DIRECTED   rosuvastatin (CRESTOR) 40 MG tablet Take 1 tablet by mouth once daily   No facility-administered encounter medications on file as of 09/12/2023.    Allergies (verified) Patient has no known allergies.   History: Past Medical History:  Diagnosis Date   Anxiety    CAD (coronary artery disease)    2V CABG in 2012   Diabetes mellitus without complication (HCC)    Hyperlipidemia    Hypertension    Past Surgical History:  Procedure Laterality Date   CORONARY ARTERY BYPASS GRAFT     2V CABG in [place]   Family History  Problem Relation Age of Onset   Cancer Mother    Heart attack Father 19  Diabetes Brother    Social History   Socioeconomic History   Marital status: Legally Separated    Spouse name: Not on file   Number of children: 2   Years of education: Not on file   Highest education level: Not on file  Occupational History   Occupation: Retired-Sold building supplies  Tobacco Use   Smoking status: Never   Smokeless tobacco: Never  Vaping Use   Vaping status: Never Used  Substance and Sexual Activity   Alcohol use: Never   Drug use: Never   Sexual activity: Not on file  Other Topics Concern   Not on file  Social History Narrative   Lives alone.   Social Determinants of Health   Financial  Resource Strain: Low Risk  (09/12/2023)   Overall Financial Resource Strain (CARDIA)    Difficulty of Paying Living Expenses: Not very hard  Food Insecurity: No Food Insecurity (09/12/2023)   Hunger Vital Sign    Worried About Running Out of Food in the Last Year: Never true    Ran Out of Food in the Last Year: Never true  Transportation Needs: No Transportation Needs (09/12/2023)   PRAPARE - Administrator, Civil Service (Medical): No    Lack of Transportation (Non-Medical): No  Physical Activity: Sufficiently Active (09/12/2023)   Exercise Vital Sign    Days of Exercise per Week: 5 days    Minutes of Exercise per Session: 60 min  Stress: No Stress Concern Present (09/12/2023)   [employer] of Occupational Health - Occupational Stress Questionnaire    Feeling of Stress : Not at all  Social Connections: Moderately Integrated (09/12/2023)   Social Connection and Isolation Panel [NHANES]    Frequency of Communication with Friends and Family: More than three times a week    Frequency of Social Gatherings with Friends and Family: More than three times a week    Attends Religious Services: More than 4 times per year    Active Member of [employer] or Organizations: Yes    Attends Banker Meetings: 1 to 4 times per year    Marital Status: Separated    Tobacco Counseling Counseling given: Not Answered   Clinical Intake:  Pre-visit preparation completed: Yes  Pain : No/denies pain     BMI - recorded: 29.03 Nutritional Status: BMI 25 -29 Overweight Nutritional Risks: None Diabetes: Yes CBG done?: No Did pt. bring in CBG monitor from home?: No  How often do you need to have someone help you when you read instructions, pamphlets, or other written materials from your doctor or pharmacy?: 1 - Never  Interpreter Needed?: No  Information entered by :: Benyamin Jeff, RMA   Activities of Daily Living    09/12/2023    3:21 PM  In your present state of  health, do you have any difficulty performing the following activities:  Hearing? 0  Vision? 0  Difficulty concentrating or making decisions? 0  Walking or climbing stairs? 0  Dressing or bathing? 0  Doing errands, shopping? 0  Preparing Food and eating ? N  Using the Toilet? N  In the past six months, have you accidently leaked urine? N  Do you have problems with loss of bowel control? N  Managing your Medications? N  Managing your Finances? N  Housekeeping or managing your Housekeeping? N    Patient Care Team: [name], MD as PCP - General (Internal Medicine) Kathleene Hazel, MD as PCP - Cardiology (Cardiology) Cathey Endow,  Elige Radon, MD as Consulting Physician (Ophthalmology)  Indicate any recent Medical Services you may have received from other than [facility] providers in the past year (date may be approximate).     Assessment:   This is a routine wellness examination for Shane Garrett.  Hearing/Vision screen Hearing Screening - Comments:: Denies hearing difficulties   Vision Screening - Comments:: Denies vision issues.   Goals Addressed               This Visit's Progress     Patient Stated (pt-stated)        Keep living and stay healthy for as long as he can.      Depression Screen    09/12/2023    3:28 PM 01/24/2023    8:51 AM 07/17/2022    9:44 AM 06/08/2022    9:02 AM 02/08/2022    8:21 AM 07/12/2021    9:32 AM 01/30/2021    9:55 AM  PHQ 2/9 Scores  PHQ - 2 Score 0 0 0 0 0 0 0  PHQ- 9 Score 4  2   0 0    Fall Risk    09/12/2023    3:24 PM 01/24/2023    8:50 AM 07/17/2022    9:44 AM 06/08/2022    9:01 AM 02/08/2022    8:21 AM  Fall Risk   Falls in the past year? 0 0 0 0 0  Number falls in past yr: 0 0 0  0  Injury with Fall? 0 0 0  0  Risk for fall due to : No Fall Risks No Fall Risks No Fall Risks    Follow up Falls prevention discussed;Falls evaluation completed Falls evaluation completed Falls evaluation completed      MEDICARE RISK AT  HOME: Medicare Risk at Home Any stairs in or around the home?: No Home free of loose throw rugs in walkways, pet beds, electrical cords, etc?: Yes Adequate lighting in your home to reduce risk of falls?: Yes Life alert?: No Use of a cane, walker or w/c?: No Grab bars in the bathroom?: No Shower chair or bench in shower?: Yes Elevated toilet seat or a handicapped toilet?: Yes  TIMED UP AND GO:  Was the test performed?  No    Cognitive Function:        09/12/2023    3:25 PM 06/08/2022    9:06 AM  6CIT Screen  What Year? 0 points 0 points  What month? 0 points 0 points  What time? 0 points 0 points  Count back from 20 0 points 0 points  Months in reverse 2 points 0 points  Repeat phrase 0 points 0 points  Total Score 2 points 0 points    Immunizations Immunization History  Administered Date(s) Administered   PFIZER(Purple Top)SARS-COV-2 Vaccination 05/07/2020, 06/04/2020   Pneumococcal Conjugate-13 04/14/2019   Pneumococcal Polysaccharide-23 07/19/2022   Tdap 07/12/2021   Zoster Recombinant(Shingrix) 12/01/2018, 10/09/2019   Zoster, Live 10/01/2013    TDAP status: Up to date  Flu Vaccine status: Declined, Education has been provided regarding the importance of this vaccine but patient still declined. Advised may receive this vaccine at local pharmacy or Health Dept. Aware to provide a copy of the vaccination record if obtained from local pharmacy or Health Dept. Verbalized acceptance and understanding.  Pneumococcal vaccine status: Up to date  Covid-19 vaccine status: Declined, Education has been provided regarding the importance of this vaccine but patient still declined. Advised may receive this vaccine at local pharmacy or Health  Dept.or vaccine clinic. Aware to provide a copy of the vaccination record if obtained from local pharmacy or Health Dept. Verbalized acceptance and understanding.  Qualifies for Shingles Vaccine? Yes   Zostavax completed Yes   Shingrix  Completed?: Yes  Screening Tests Health Maintenance  Topic Date Due   INFLUENZA VACCINE  Never done   Diabetic kidney evaluation - Urine ACR  07/20/2023   HEMOGLOBIN A1C  07/25/2023   OPHTHALMOLOGY EXAM  10/23/2023 (Originally 04/11/2023)   Diabetic kidney evaluation - eGFR measurement  01/24/2024   FOOT EXAM  01/24/2024   Medicare Annual Wellness (AWV)  09/11/2024   Colonoscopy  03/09/2027   DTaP/Tdap/Td (2 - Td or Tdap) 07/13/2031   Pneumonia Vaccine 45+ Years old  Completed   Hepatitis C Screening  Completed   Zoster Vaccines- Shingrix  Completed   HPV VACCINES  Aged Out   COVID-19 Vaccine  Discontinued    Health Maintenance  Health Maintenance Due  Topic Date Due   INFLUENZA VACCINE  Never done   Diabetic kidney evaluation - Urine ACR  07/20/2023   HEMOGLOBIN A1C  07/25/2023    Colorectal cancer screening: Type of screening: Colonoscopy. Completed 03/08/2017. Repeat every 10 years  Lung Cancer Screening: (Low Dose CT Chest recommended if Age 89-80 years, 20 pack-year currently smoking OR have quit w/in 15years.) does not qualify.   Lung Cancer Screening Referral: N/A  Additional Screening:  Hepatitis C Screening: does qualify; Completed 04/14/2019  Vision Screening: Recommended annual ophthalmology exams for early detection of glaucoma and other disorders of the eye. Is the patient up to date with their annual eye exam?  Yes  Who is the provider or what is the name of the office in which the patient attends annual eye exams? Dr. Cathey Endow  If pt is not established with a provider, would they like to be referred to a provider to establish care? No .   Dental Screening: Recommended annual dental exams for proper oral hygiene  Diabetic Foot Exam: Diabetic Foot Exam: Completed 01/24/2023  Community Resource Referral / Chronic Care Management: CRR required this visit?  No   CCM required this visit?  No     Plan:     I have personally reviewed and noted the  following in the patient's chart:   Medical and social history Use of alcohol, tobacco or illicit drugs  Current medications and supplements including opioid prescriptions. Patient is not currently taking opioid prescriptions. Functional ability and status Nutritional status Physical activity Advanced directives List of other physicians Hospitalizations, surgeries, and ER visits in previous 12 months Vitals Screenings to include cognitive, depression, and falls Referrals and appointments  In addition, I have reviewed and discussed with patient certain preventive protocols, quality metrics, and best practice recommendations. A written personalized care plan for preventive services as well as general preventive health recommendations were provided to patient.     Olympia Adelsberger L Fancy Dunkley, CMA   09/12/2023   After Visit Summary: (MyChart) Due to this being a telephonic visit, the after visit summary with patients personalized plan was offered to patient via MyChart   Nurse Notes: Patient declines the Flu vaccine.  He has an eye doctor appointment coming up with Dr. Cathey Endow.  Patient had no other concerns to address today.

## 2023-09-13 ENCOUNTER — Other Ambulatory Visit: Payer: Self-pay | Admitting: Internal Medicine

## 2023-09-13 DIAGNOSIS — E119 Type 2 diabetes mellitus without complications: Secondary | ICD-10-CM

## 2023-09-13 DIAGNOSIS — I25118 Atherosclerotic heart disease of native coronary artery with other forms of angina pectoris: Secondary | ICD-10-CM

## 2023-09-18 ENCOUNTER — Encounter: Payer: Self-pay | Admitting: Internal Medicine

## 2023-09-18 ENCOUNTER — Ambulatory Visit (INDEPENDENT_AMBULATORY_CARE_PROVIDER_SITE_OTHER): Payer: PPO | Admitting: Internal Medicine

## 2023-09-18 VITALS — BP 136/100 | HR 62 | Temp 98.1°F | Resp 16 | Ht 73.0 in | Wt 215.2 lb

## 2023-09-18 DIAGNOSIS — E119 Type 2 diabetes mellitus without complications: Secondary | ICD-10-CM

## 2023-09-18 DIAGNOSIS — Z0000 Encounter for general adult medical examination without abnormal findings: Secondary | ICD-10-CM

## 2023-09-18 DIAGNOSIS — E785 Hyperlipidemia, unspecified: Secondary | ICD-10-CM

## 2023-09-18 DIAGNOSIS — Z0001 Encounter for general adult medical examination with abnormal findings: Secondary | ICD-10-CM

## 2023-09-18 DIAGNOSIS — I25118 Atherosclerotic heart disease of native coronary artery with other forms of angina pectoris: Secondary | ICD-10-CM | POA: Diagnosis not present

## 2023-09-18 DIAGNOSIS — I10 Essential (primary) hypertension: Secondary | ICD-10-CM

## 2023-09-18 DIAGNOSIS — R972 Elevated prostate specific antigen [PSA]: Secondary | ICD-10-CM | POA: Diagnosis not present

## 2023-09-18 DIAGNOSIS — Z7984 Long term (current) use of oral hypoglycemic drugs: Secondary | ICD-10-CM

## 2023-09-18 LAB — LIPID PANEL
Cholesterol: 115 mg/dL (ref 0–200)
HDL: 38 mg/dL — ABNORMAL LOW (ref >39.00)
LDL Cholesterol: 62 mg/dL (ref 0–99)
NonHDL: 76.96
Total CHOL/HDL Ratio: 3
Triglycerides: 77 mg/dL (ref 0.0–149.0)
VLDL: 15.4 mg/dL (ref 0.0–40.0)

## 2023-09-18 LAB — CBC WITH DIFFERENTIAL/PLATELET
Basophils Absolute: 0.1 10*3/uL (ref 0.0–0.1)
Basophils Relative: 1.3 % (ref 0.0–3.0)
Eosinophils Absolute: 0.5 10*3/uL (ref 0.0–0.7)
Eosinophils Relative: 8.1 % — ABNORMAL HIGH (ref 0.0–5.0)
HCT: 41.9 % (ref 39.0–52.0)
Hemoglobin: 13.8 g/dL (ref 13.0–17.0)
Lymphocytes Relative: 26.7 % (ref 12.0–46.0)
Lymphs Abs: 1.7 10*3/uL (ref 0.7–4.0)
MCHC: 33.1 g/dL (ref 30.0–36.0)
MCV: 90.2 fL (ref 78.0–100.0)
Monocytes Absolute: 0.5 10*3/uL (ref 0.1–1.0)
Monocytes Relative: 7.7 % (ref 3.0–12.0)
Neutro Abs: 3.7 10*3/uL (ref 1.4–7.7)
Neutrophils Relative %: 56.2 % (ref 43.0–77.0)
Platelets: 190 10*3/uL (ref 150.0–400.0)
RBC: 4.64 Mil/uL (ref 4.22–5.81)
RDW: 14.1 % (ref 11.5–15.5)
WBC: 6.5 10*3/uL (ref 4.0–10.5)

## 2023-09-18 LAB — PSA: PSA: 4.17 ng/mL — ABNORMAL HIGH (ref 0.10–4.00)

## 2023-09-18 LAB — MICROALBUMIN / CREATININE URINE RATIO
Creatinine,U: 49.4 mg/dL
Microalb Creat Ratio: 1.4 mg/g (ref 0.0–30.0)
Microalb, Ur: <0.7 mg/dL (ref 0.0–1.9)

## 2023-09-18 LAB — BASIC METABOLIC PANEL
BUN: 15 mg/dL (ref 6–23)
CO2: 29 meq/L (ref 19–32)
Calcium: 10.2 mg/dL (ref 8.4–10.5)
Chloride: 104 meq/L (ref 96–112)
Creatinine, Ser: 1.05 mg/dL (ref 0.40–1.50)
GFR: 72.38 mL/min (ref >60.00)
Glucose, Bld: 98 mg/dL (ref 70–99)
Potassium: 4.5 meq/L (ref 3.5–5.1)
Sodium: 139 meq/L (ref 135–145)

## 2023-09-18 LAB — URINALYSIS, ROUTINE W REFLEX MICROSCOPIC
Bilirubin Urine: NEGATIVE
Hgb urine dipstick: NEGATIVE
Ketones, ur: NEGATIVE
Leukocytes,Ua: NEGATIVE
Nitrite: NEGATIVE
RBC / HPF: NONE SEEN (ref 0–?)
Specific Gravity, Urine: <=1.005 — AB (ref 1.000–1.030)
Total Protein, Urine: NEGATIVE
Urine Glucose: NEGATIVE
Urobilinogen, UA: 0.2 (ref 0.0–1.0)
WBC, UA: NONE SEEN (ref 0–?)
pH: 6 (ref 5.0–8.0)

## 2023-09-18 LAB — HEMOGLOBIN A1C: Hgb A1c MFr Bld: 6.3 % (ref 4.6–6.5)

## 2023-09-18 MED ORDER — OLMESARTAN MEDOXOMIL 20 MG PO TABS: 20.0000 mg | ORAL_TABLET | Freq: Every day | ORAL | 0 refills | Status: DC

## 2023-09-18 NOTE — Patient Instructions (Signed)
Health Maintenance, Male Adopting a healthy lifestyle and getting preventive care are important in promoting health and wellness. Ask your health care provider about: The right schedule for you to have regular tests and exams. Things you can do on your own to prevent diseases and keep yourself healthy. What should I know about diet, weight, and exercise? Eat a healthy diet  Eat a diet that includes plenty of vegetables, fruits, low-fat dairy products, and lean protein. Do not eat a lot of foods that are high in solid fats, added sugars, or sodium. Maintain a healthy weight Body mass index (BMI) is a measurement that can be used to identify possible weight problems. It estimates body fat based on height and weight. Your health care provider can help determine your BMI and help you achieve or maintain a healthy weight. Get regular exercise Get regular exercise. This is one of the most important things you can do for your health. Most adults should: Exercise for at least 150 minutes each week. The exercise should increase your heart rate and make you sweat (moderate-intensity exercise). Do strengthening exercises at least twice a week. This is in addition to the moderate-intensity exercise. Spend less time sitting. Even light physical activity can be beneficial. Watch cholesterol and blood lipids Have your blood tested for lipids and cholesterol at 69 years of age, then have this test every 5 years. You may need to have your cholesterol levels checked more often if: Your lipid or cholesterol levels are high. You are older than 69 years of age. You are at high risk for heart disease. What should I know about cancer screening? Many types of cancers can be detected early and may often be prevented. Depending on your health history and family history, you may need to have cancer screening at various ages. This may include screening for: Colorectal cancer. Prostate cancer. Skin cancer. Lung  cancer. What should I know about heart disease, diabetes, and high blood pressure? Blood pressure and heart disease High blood pressure causes heart disease and increases the risk of stroke. This is more likely to develop in people who have high blood pressure readings or are overweight. Talk with your health care provider about your target blood pressure readings. Have your blood pressure checked: Every 3-5 years if you are 18-39 years of age. Every year if you are 40 years old or older. If you are between the ages of 65 and 75 and are a current or former smoker, ask your health care provider if you should have a one-time screening for abdominal aortic aneurysm (AAA). Diabetes Have regular diabetes screenings. This checks your fasting blood sugar level. Have the screening done: Once every three years after age 45 if you are at a normal weight and have a low risk for diabetes. More often and at a younger age if you are overweight or have a high risk for diabetes. What should I know about preventing infection? Hepatitis B If you have a higher risk for hepatitis B, you should be screened for this virus. Talk with your health care provider to find out if you are at risk for hepatitis B infection. Hepatitis C Blood testing is recommended for: Everyone born from 1945 through 1965. Anyone with known risk factors for hepatitis C. Sexually transmitted infections (STIs) You should be screened each year for STIs, including gonorrhea and chlamydia, if: You are sexually active and are younger than 69 years of age. You are older than 69 years of age and your   health care provider tells you that you are at risk for this type of infection. Your sexual activity has changed since you were last screened, and you are at increased risk for chlamydia or gonorrhea. Ask your health care provider if you are at risk. Ask your health care provider about whether you are at high risk for HIV. Your health care provider  may recommend a prescription medicine to help prevent HIV infection. If you choose to take medicine to prevent HIV, you should first get tested for HIV. You should then be tested every 3 months for as long as you are taking the medicine. Follow these instructions at home: Alcohol use Do not drink alcohol if your health care provider tells you not to drink. If you drink alcohol: Limit how much you have to 0-2 drinks a day. Know how much alcohol is in your drink. In the [place] one drink equals one 12 oz bottle of beer (355 mL), one 5 oz glass of wine (148 mL), or one 1 oz glass of hard liquor (44 mL). Lifestyle Do not use any products that contain nicotine or tobacco. These products include cigarettes, chewing tobacco, and vaping devices, such as e-cigarettes. If you need help quitting, ask your health care provider. Do not use street drugs. Do not share needles. Ask your health care provider for help if you need support or information about quitting drugs. General instructions Schedule regular health, dental, and eye exams. Stay current with your vaccines. Tell your health care provider if: You often feel depressed. You have ever been abused or do not feel safe at home. Summary Adopting a healthy lifestyle and getting preventive care are important in promoting health and wellness. Follow your health care provider's instructions about healthy diet, exercising, and getting tested or screened for diseases. Follow your health care provider's instructions on monitoring your cholesterol and blood pressure. This information is not intended to replace advice given to you by your health care provider. Make sure you discuss any questions you have with your health care provider. Document Revised: 04/03/2021 Document Reviewed: 04/03/2021 Elsevier Patient Education  2024 [employer]  

## 2023-09-18 NOTE — Progress Notes (Signed)
Subjective:  Patient ID: Shane Garrett, male    DOB: [birth year]  Age: 67 y.o. MRN: [id number]  CC: Annual Exam, Hypertension, Hyperlipidemia, and Diabetes   HPI Shane Garrett presents for a CPX and f/up ---  According the RX refills he is not taking the ARB. He plays pickle ball and has good endurance.  Outpatient Medications Prior to Visit  Medication Sig Dispense Refill   allopurinol (ZYLOPRIM) 100 MG tablet Take 1 tablet by mouth once daily 90 tablet 0   aspirin EC 81 MG tablet Take by mouth daily.     citalopram (CELEXA) 40 MG tablet Take 1 tablet by mouth once daily 90 tablet 0   clonazePAM (KLONOPIN) 0.5 MG tablet Take 1 tablet (0.5 mg total) by mouth daily as needed for anxiety. 90 tablet 1   colchicine 0.6 MG tablet Take by mouth as needed.      ezetimibe (ZETIA) 10 MG tablet Take 1 tablet by mouth once daily 90 tablet 0   Insulin Pen Needle 32G X 6 MM MISC 1 Act by Does not apply route once a week. 30 each 1   isosorbide mononitrate (IMDUR) 30 MG 24 hr tablet Take 1 tablet by mouth once daily 90 tablet 2   metoprolol succinate (TOPROL-XL) 50 MG 24 hr tablet TAKE 1 TABLET BY MOUTH ONCE DAILY WITH MEALS 90 tablet 0   nitroGLYCERIN (NITROSTAT) 0.4 MG SL tablet Place under the tongue.     OZEMPIC, 2 MG/DOSE, 8 MG/3ML SOPN INJECT 2MG  INTO THE SKIN ONCE PER WEEK 9 mL 0   rosuvastatin (CRESTOR) 40 MG tablet Take 1 tablet by mouth once daily 90 tablet 0   metFORMIN (GLUCOPHAGE-XR) 750 MG 24 hr tablet TAKE 2 TABLETS BY MOUTH ONCE DAILY WITH BREAKFAST 180 tablet 0   methocarbamol (ROBAXIN) 500 MG tablet Take 1 tablet (500 mg total) by mouth 4 (four) times daily. 30 tablet 0   fluticasone (FLONASE) 50 MCG/ACT nasal spray Place 2 sprays into both nostrils daily. 16 g 0   olmesartan (BENICAR) 20 MG tablet Take 1 tablet (20 mg total) by mouth daily. 90 tablet 1   olopatadine (PATANOL) 0.1 % ophthalmic solution      No facility-administered medications prior to visit.    ROS Review  of Systems  Constitutional:  Negative for appetite change, chills, diaphoresis and fatigue.  HENT: Negative.    Eyes: Negative.   Respiratory: Negative.  Negative for cough, chest tightness, shortness of breath and wheezing.   Cardiovascular:  Negative for chest pain, palpitations and leg swelling.  Gastrointestinal:  Negative for abdominal pain, constipation, diarrhea, nausea and vomiting.  Genitourinary: Negative.  Negative for difficulty urinating.  Musculoskeletal: Negative.   Skin: Negative.   Neurological: Negative.  Negative for dizziness, weakness and light-headedness.  Hematological:  Negative for adenopathy. Does not bruise/bleed easily.  Psychiatric/Behavioral:  Positive for confusion and decreased concentration.     Objective:  BP (!) 136/100 (BP Location: Left Arm, Patient Position: Sitting, Cuff Size: Normal) Comment: BP (R) 138/96 (L) 130/100  Pulse 62   Temp 98.1 F (36.7 C) (Oral)   Resp 16   Ht 6\' 1"  (1.854 m)   Wt 215 lb 3.2 oz (97.6 kg)   SpO2 99%   BMI 28.39 kg/m   BP Readings from Last 3 Encounters:  09/18/23 (!) 136/100  01/24/23 (!) 130/90  12/14/22 126/76    Wt Readings from Last 3 Encounters:  09/18/23 215 lb 3.2 oz (97.6 kg)  09/12/23  220 lb (99.8 kg)  01/24/23 232 lb (105.2 kg)    Physical Exam Vitals reviewed.  HENT:     Mouth/Throat:     Mouth: Mucous membranes are moist.  Eyes:     General: No scleral icterus.    Conjunctiva/sclera: Conjunctivae normal.  Cardiovascular:     Rate and Rhythm: Regular rhythm. Bradycardia present.     Pulses: Normal pulses.     Heart sounds: No murmur heard.    No friction rub. No gallop.     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     Breath sounds: No stridor. No wheezing, rhonchi or rales.  Abdominal:     General: Abdomen is flat.     Palpations: There is no mass.     Tenderness: There is no abdominal  tenderness. There is no guarding.     Hernia: No hernia is present.  Musculoskeletal:        General: Normal range of motion.     Cervical back: Neck supple.     Right lower leg: No edema.     Left lower leg: No edema.  Lymphadenopathy:     Cervical: No cervical adenopathy.  Skin:    General: Skin is warm and dry.  Neurological:     General: No focal deficit present.     Mental Status: He is alert. Mental status is at baseline.  Psychiatric:        Mood and Affect: Mood normal.        Behavior: Behavior normal.     Lab Results  Component Value Date   WBC 6.5 09/18/2023   HGB 13.8 09/18/2023   HCT 41.9 09/18/2023   PLT 190.0 09/18/2023   GLUCOSE 98 09/18/2023   CHOL 115 09/18/2023   TRIG 77.0 09/18/2023   HDL 38.00 (L) 09/18/2023   LDLDIRECT 67.0 04/14/2020   LDLCALC 62 09/18/2023   ALT 17 01/24/2023   AST 19 01/24/2023   NA 139 09/18/2023   K 4.5 09/18/2023   CL 104 09/18/2023   CREATININE 1.05 09/18/2023   BUN 15 09/18/2023   CO2 29 09/18/2023   TSH 3.04 01/24/2023   PSA 4.17 (H) 09/18/2023   HGBA1C 6.3 09/18/2023   MICROALBUR <0.7 09/18/2023    CT ANGIO CHEST AORTA W/ & OR WO/CM & GATING ([facility] ONLY)  Result Date: 01/01/2022 CLINICAL DATA:  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 EXAM: CT ANGIOGRAPHY CHEST WITH CONTRAST TECHNIQUE: Multidetector CT imaging of the chest was performed using the standard protocol during bolus administration of intravenous contrast. Multiplanar CT image reconstructions and MIPs were obtained to evaluate the vascular anatomy. RADIATION DOSE REDUCTION: This exam was performed according to the departmental dose-optimization program which includes automated exposure control, adjustment of the mA and/or kV according to patient size and/or use of iterative reconstruction technique. CONTRAST:  75mL OMNIPAQUE IOHEXOL 350 MG/ML SOLN COMPARISON:  Report from an echocardiogram dated 12/18/2021 FINDINGS:  Cardiovascular: Caliber of the aortic root is top-normal measuring 4 cm at the level of the sinuses of Valsalva. The ascending thoracic aorta is top-normal in caliber and not overtly aneurysmal measuring 3.9 cm in greatest estimated diameter. The thoracic aorta is moderately tortuous. The proximal arch measures 3.4 cm and the distal arch 2.8 cm. The descending thoracic aorta measures 2.5 cm. No evidence of aortic dissection. Proximal great  vessels demonstrate normal patency and normal branching anatomy. There is evidence of prior CABG. The heart size is normal. No pericardial fluid identified. Central pulmonary arteries are normal in caliber. Mediastinum/Nodes: No enlarged mediastinal, hilar, or axillary lymph nodes. The trachea, and esophagus demonstrate no significant findings. Low-density mid right thyroid nodule measures approximately 14 mm in greatest diameter. Lungs/Pleura: There is no evidence of pulmonary edema, consolidation, pneumothorax, nodule or pleural fluid. Upper Abdomen: No acute abnormality. Musculoskeletal: No chest wall abnormality. No acute or significant osseous findings. Review of the MIP images confirms the above findings. IMPRESSION: 1. No overt aneurysmal dilatation of the thoracic aorta. The aortic root and ascending thoracic aorta are top-normal in caliber with the root measuring 4 cm and the ascending thoracic aorta at 3.9 cm. The thoracic aorta is also moderately tortuous. 2. 1.4 cm right thyroid nodule. 1.4 cm incidental right thyroid nodule. No follow-up imaging is recommended. Reference: J Am Coll Radiol. 2015 Feb;12(2): 143-50 Electronically Signed   By: Irish Lack M.D.   On: 01/01/2022 09:33    Assessment & Plan:   Hyperlipidemia with target LDL less than 70- LDL goal achieved. Doing well on the statin  -     Lipid panel; Future  Type 2 diabetes mellitus without complication, without long-term current use of insulin (HCC) - His blood sugar is well controlled. -      Basic metabolic panel; Future -     Urinalysis, Routine w reflex microscopic; Future -     Hemoglobin A1c; Future -     Microalbumin / creatinine urine ratio; Future -     Olmesartan Medoxomil; Take 1 tablet (20 mg total) by mouth daily.  Dispense: 90 tablet; Refill: 0 -     metFORMIN HCl ER; Take 1 tablet (750 mg total) by mouth daily with breakfast.  Dispense: 90 tablet; Refill: 1  Primary hypertension- EKG is negative for LVH but BP is not at goal. Will restart the ARB. -     Basic metabolic panel; Future -     CBC with Differential/Platelet; Future -     Urinalysis, Routine w reflex microscopic; Future -     EKG 12-Lead -     Olmesartan Medoxomil; Take 1 tablet (20 mg total) by mouth daily.  Dispense: 90 tablet; Refill: 0 -     AMB Referral [facility] Care Management  Coronary artery disease of native artery of native heart with stable angina pectoris (HCC) -     EKG 12-Lead -     Olmesartan Medoxomil; Take 1 tablet (20 mg total) by mouth daily.  Dispense: 90 tablet; Refill: 0  Elevated PSA- His PSA is not rising. -     PSA; Future  Encounter for general adult medical examination with abnormal findings - Exam completed, labs reviewed, vaccines reviewed, cancer screenings are UTD,  pt ed material was given.      Follow-up: Return in about 3 months (around 12/19/2023).  Sanda Linger, MD

## 2023-09-19 ENCOUNTER — Other Ambulatory Visit: Payer: Self-pay | Admitting: Internal Medicine

## 2023-09-19 ENCOUNTER — Telehealth: Payer: Self-pay

## 2023-09-19 DIAGNOSIS — E119 Type 2 diabetes mellitus without complications: Secondary | ICD-10-CM

## 2023-09-19 MED ORDER — METFORMIN HCL ER 750 MG PO TB24: 750.0000 mg | ORAL_TABLET | Freq: Every day | ORAL | 1 refills | Status: DC

## 2023-09-19 NOTE — Progress Notes (Signed)
   Care Guide Note  09/19/2023 Name: KEONTAYE BARBATO MRN: [id number] DOB: [birth year]  Referred by: [name], MD Reason for referral : Care Coordination (Outreach to schedule with Pharm d )   REIDEN TULK is a 67 y.o. year old male who is a primary care patient of [name], MD. AFRAZ HULA was referred to the pharmacist for assistance related to HTN.    An unsuccessful telephone outreach was attempted today to contact the patient who was referred to the pharmacy team for assistance with medication management. Additional attempts will be made to contact the patient.   Penne Lash, RMA Care Guide [facility]  [place], [place] [address] Direct Dial: [phone] [email or web address]

## 2023-09-29 ENCOUNTER — Other Ambulatory Visit: Payer: Self-pay | Admitting: Internal Medicine

## 2023-09-29 DIAGNOSIS — F411 Generalized anxiety disorder: Secondary | ICD-10-CM

## 2023-09-30 NOTE — Progress Notes (Signed)
   Care Guide Note  09/30/2023 Name: [name] MRN: [id number] DOB: [birth year]  Referred by: [name], MD Reason for referral : Care Coordination (Outreach to schedule with Pharm d )   Shane Garrett is a 67 y.o. year old male who is a primary care patient of [name], MD. [name] was referred to the pharmacist for assistance related to HTN.    Successful contact was made with the patient to discuss pharmacy services including being ready for the pharmacist to call at least 5 minutes before the scheduled appointment time, to have medication bottles and any blood sugar or blood pressure readings ready for review. The patient agreed to meet with the pharmacist via with the pharmacist via telephone visit on (date/time).  10/04/2023  Penne Lash, RMA Care Guide [facility]  [place], [place] [address] Direct Dial: [phone] [email or web address]

## 2023-10-04 ENCOUNTER — Other Ambulatory Visit: Payer: PPO | Admitting: Pharmacist

## 2023-10-04 DIAGNOSIS — I10 Essential (primary) hypertension: Secondary | ICD-10-CM

## 2023-10-04 NOTE — Progress Notes (Signed)
10/04/2023 Name: Shane Garrett MRN: [id number] DOB: [birth year]  Chief Complaint  Patient presents with   Hypertension   Medication Management    Shane Garrett is a 67 y.o. year old male who presented for a telephone visit.   They were referred to the pharmacist by their PCP for assistance in managing hypertension.    Subjective:  Care Team: Primary Care Provider: [name], MD ; Next Scheduled Visit: not scheduled  Medication Access/Adherence  Current Pharmacy:  [id number] - [place], [place] - [address] [place] [address] Phone: [phone] Fax: [phone]   Patient reports affordability concerns with their medications: No  Patient reports access/transportation concerns to their pharmacy: No  Patient reports adherence concerns with their medications:  No     Hypertension:  Current medications: Olmesartan 20 mg daily (restarted 10/23), metoprolol succinate 50 mg daily Medications previously tried: ramipril  Patient does not have a validated, automated, upper arm home BP cuff Current blood pressure readings: none recent -Notes he will check BP at the pharmacy/store  Current meal patterns: reports not eating high sodium foods  Objective: BP Readings from Last 3 Encounters:  09/18/23 (!) 136/100  01/24/23 (!) 130/90  12/14/22 126/76     Lab Results  Component Value Date   HGBA1C 6.3 09/18/2023    Lab Results  Component Value Date   CREATININE 1.05 09/18/2023   BUN 15 09/18/2023   NA 139 09/18/2023   K 4.5 09/18/2023   CL 104 09/18/2023   CO2 29 09/18/2023    Lab Results  Component Value Date   CHOL 115 09/18/2023   HDL 38.00 (L) 09/18/2023   LDLCALC 62 09/18/2023   LDLDIRECT 67.0 04/14/2020   TRIG 77.0 09/18/2023   CHOLHDL 3 09/18/2023    Medications Reviewed Today     Reviewed by Bonita Quin, RPH (Pharmacist) on 10/04/23 at 1444  Med List Status: <None>   Medication Order  Taking? Sig Documenting Provider Last Dose Status Informant  allopurinol (ZYLOPRIM) 100 MG tablet [id number]  Take 1 tablet by mouth once daily [name], MD  Active   aspirin EC 81 MG tablet [id number]  Take by mouth daily. [provider]  Active   citalopram (CELEXA) 40 MG tablet [id number]  Take 1 tablet by mouth once daily [name], MD  Active   clonazePAM (KLONOPIN) 0.5 MG tablet [id number]  TAKE 1 TABLET BY MOUTH ONCE DAILY AS NEEDED FOR ANXIETY [name], MD  Active   colchicine 0.6 MG tablet [id number]  Take by mouth as needed.  [provider]  Active   ezetimibe (ZETIA) 10 MG tablet [id number]  Take 1 tablet by mouth once daily [name], MD  Active   Insulin Pen Needle 32G X 6 MM MISC [id number]  1 Act by Does not apply route once a week. [name], MD  Active   isosorbide mononitrate (IMDUR) 30 MG 24 hr tablet [id number]  Take 1 tablet by mouth once daily Kathleene Hazel, MD  Active   metFORMIN (GLUCOPHAGE-XR) 750 MG 24 hr tablet [id number]  Take 1 tablet (750 mg total) by mouth daily with breakfast. [name], MD  Active   methocarbamol (ROBAXIN) 500 MG tablet [id number]  Take 1 tablet (500 mg total) by mouth 4 (four) times daily. Henson, Vickie L, NP-C  Active   metoprolol succinate (TOPROL-XL) 50 MG 24 hr tablet [id number]  TAKE  1 TABLET BY MOUTH ONCE DAILY WITH MEALS [name], MD  Active   nitroGLYCERIN (NITROSTAT) 0.4 MG SL tablet [id number]  Place under the tongue. [provider]  Active            Med Note Daphine Deutscher, Farrel Gobble   Fri Jun 08, 2022  8:59 AM) AS NEEDED  olmesartan (BENICAR) 20 MG tablet [id number] Yes Take 1 tablet (20 mg total) by mouth daily. [name], MD Taking Active   OZEMPIC, 2 MG/DOSE, 8 MG/3ML Namon Cirri [id number]  INJECT 2MG  INTO THE SKIN ONCE PER WEEK [name], MD  Active   rosuvastatin (CRESTOR) 40 MG tablet [id number]  Take 1 tablet by mouth once daily [name], MD  Active                Assessment/Plan:   Hypertension: - Currently uncontrolled, BP goal <130/80 - Reviewed long term cardiovascular and renal outcomes of uncontrolled blood pressure - Reviewed appropriate blood pressure monitoring technique and reviewed goal blood pressure. Recommended to check home blood pressure and heart rate over the next 2 weeks - Will call back for BP readings    Follow Up Plan: 11/25  Arbutus Leas, PharmD, BCPS Clinical Pharmacist [facility] [phone]

## 2023-10-04 NOTE — Patient Instructions (Signed)
It was a pleasure speaking with you today!  Please check your blood pressure several times over the next 2 weeks so we can get an idea if your blood pressure is still high after adding back olmesartan 20 mg daily.  I'll call back 10/21/23.  Feel free to call with any questions or concerns!  Arbutus Leas, PharmD, BCPS [facility] Clinical Pharmacist [facility] [phone]

## 2023-10-08 DIAGNOSIS — J0100 Acute maxillary sinusitis, unspecified: Secondary | ICD-10-CM | POA: Diagnosis not present

## 2023-10-14 ENCOUNTER — Other Ambulatory Visit (HOSPITAL_COMMUNITY): Payer: Self-pay

## 2023-10-14 ENCOUNTER — Telehealth: Payer: Self-pay | Admitting: Cardiovascular Disease

## 2023-10-14 MED ORDER — NITROGLYCERIN 0.4 MG SL SUBL
0.4000 mg | SUBLINGUAL_TABLET | SUBLINGUAL | 0 refills | Status: DC | PRN
  Filled 2023-10-14: qty 25, 1d supply, fill #0

## 2023-10-14 NOTE — Telephone Encounter (Signed)
*  STAT* If patient is at the pharmacy, call can be transferred to refill team.   1. Which medications need to be refilled? (please list name of each medication and dose if known) nitroGLYCERIN (NITROSTAT) 0.4 MG SL tablet   2. Which pharmacy/location (including street and city if local pharmacy) is medication to be sent to? [facility] - [place], [place] - [address]   3. Do they need a 30 day or 90 day supply? 90

## 2023-10-14 NOTE — Telephone Encounter (Signed)
 RX sent to requested Pharmacy

## 2023-10-21 ENCOUNTER — Other Ambulatory Visit: Payer: PPO

## 2023-10-25 ENCOUNTER — Other Ambulatory Visit (HOSPITAL_COMMUNITY): Payer: Self-pay

## 2023-11-06 ENCOUNTER — Other Ambulatory Visit: Payer: PPO

## 2023-11-07 ENCOUNTER — Other Ambulatory Visit: Payer: Self-pay | Admitting: Internal Medicine

## 2023-11-07 DIAGNOSIS — I25118 Atherosclerotic heart disease of native coronary artery with other forms of angina pectoris: Secondary | ICD-10-CM

## 2023-11-11 ENCOUNTER — Encounter: Payer: Self-pay | Admitting: Internal Medicine

## 2023-11-11 DIAGNOSIS — E119 Type 2 diabetes mellitus without complications: Secondary | ICD-10-CM | POA: Diagnosis not present

## 2023-11-11 DIAGNOSIS — H2513 Age-related nuclear cataract, bilateral: Secondary | ICD-10-CM | POA: Diagnosis not present

## 2023-11-11 LAB — HM DIABETES EYE EXAM

## 2023-11-19 ENCOUNTER — Other Ambulatory Visit: Payer: Self-pay | Admitting: Internal Medicine

## 2023-11-19 DIAGNOSIS — I10 Essential (primary) hypertension: Secondary | ICD-10-CM

## 2023-11-25 ENCOUNTER — Other Ambulatory Visit: Payer: Self-pay | Admitting: Internal Medicine

## 2023-11-25 DIAGNOSIS — E785 Hyperlipidemia, unspecified: Secondary | ICD-10-CM

## 2023-11-25 DIAGNOSIS — I25118 Atherosclerotic heart disease of native coronary artery with other forms of angina pectoris: Secondary | ICD-10-CM

## 2023-12-09 ENCOUNTER — Other Ambulatory Visit: Payer: Self-pay | Admitting: Internal Medicine

## 2023-12-09 DIAGNOSIS — E119 Type 2 diabetes mellitus without complications: Secondary | ICD-10-CM

## 2023-12-09 MED ORDER — METFORMIN HCL ER 750 MG PO TB24: 750.0000 mg | ORAL_TABLET | Freq: Every day | ORAL | 0 refills | Status: DC

## 2023-12-09 NOTE — Telephone Encounter (Signed)
 Copied from CRM [id number]. Topic: Clinical - Medication Refill >> Dec 09, 2023  9:39 AM Powell NOVAK wrote: Most Recent Primary Care Visit:  Provider: MERCEDA FILE R  Department: [facility]  Visit Type: PATIENT OUTREACH 60  Date: 10/04/2023  Medication: metFORMIN  (GLUCOPHAGE -XR) 750 MG 24 hr tablet  Has the patient contacted their pharmacy? Yes-no refills (Agent: If no, request that the patient contact the pharmacy for the refill. If patient does not wish to contact the pharmacy document the reason why and proceed with request.) (Agent: If yes, when and what did the pharmacy advise?)  Is this the correct pharmacy for this prescription? yes If no, delete pharmacy and type the correct one.  This is the patient's preferred pharmacy:  [facility] [address] [place], [place] - [address] [place] [address] Phone: [phone] Fax: [phone]   Has the prescription been filled recently? yes  Is the patient out of the medication? yes  Has the patient been seen for an appointment in the last year OR does the patient have an upcoming appointment? yes  Can we respond through MyChart? yes  Agent: Please be advised that Rx refills may take up to 3 business days. We ask that you follow-up with your pharmacy.

## 2023-12-10 ENCOUNTER — Other Ambulatory Visit: Payer: Self-pay | Admitting: Internal Medicine

## 2023-12-10 DIAGNOSIS — F325 Major depressive disorder, single episode, in full remission: Secondary | ICD-10-CM

## 2023-12-10 DIAGNOSIS — M1A0790 Idiopathic chronic gout, unspecified ankle and foot, without tophus (tophi): Secondary | ICD-10-CM

## 2023-12-15 ENCOUNTER — Other Ambulatory Visit: Payer: Self-pay | Admitting: Internal Medicine

## 2023-12-15 DIAGNOSIS — E119 Type 2 diabetes mellitus without complications: Secondary | ICD-10-CM

## 2023-12-15 DIAGNOSIS — I25118 Atherosclerotic heart disease of native coronary artery with other forms of angina pectoris: Secondary | ICD-10-CM

## 2023-12-15 DIAGNOSIS — I10 Essential (primary) hypertension: Secondary | ICD-10-CM

## 2023-12-20 ENCOUNTER — Other Ambulatory Visit: Payer: Self-pay

## 2023-12-20 ENCOUNTER — Other Ambulatory Visit: Payer: Self-pay | Admitting: Internal Medicine

## 2023-12-20 DIAGNOSIS — E119 Type 2 diabetes mellitus without complications: Secondary | ICD-10-CM

## 2023-12-20 DIAGNOSIS — I25118 Atherosclerotic heart disease of native coronary artery with other forms of angina pectoris: Secondary | ICD-10-CM

## 2023-12-20 MED ORDER — METFORMIN HCL ER 750 MG PO TB24: 750.0000 mg | ORAL_TABLET | Freq: Every day | ORAL | 0 refills | Status: DC

## 2024-01-01 ENCOUNTER — Other Ambulatory Visit: Payer: Self-pay | Admitting: Cardiovascular Disease

## 2024-01-27 ENCOUNTER — Other Ambulatory Visit: Payer: Self-pay | Admitting: Cardiovascular Disease

## 2024-02-03 ENCOUNTER — Other Ambulatory Visit: Payer: Self-pay | Admitting: Internal Medicine

## 2024-02-03 MED ORDER — ISOSORBIDE MONONITRATE ER 30 MG PO TB24
30.0000 mg | ORAL_TABLET | Freq: Every day | ORAL | 0 refills | Status: DC
Start: 2024-02-03 — End: 2024-03-12

## 2024-02-03 NOTE — Telephone Encounter (Signed)
 Copied from CRM [id number]. Topic: Clinical - Prescription Issue >> Feb 03, 2024  8:47 AM Shane Garrett wrote: Reason for CRM:  Patient states Dr. Yetta Barre only prescribed him a  quantity of 15 of the isosorbide mononitrate (IMDUR) 30 MG 24 hr tablet - patient has a appointment with the provider on 4/17 but is requesting Dr. Yetta Barre to prescribe more until he can see him in office.

## 2024-02-07 ENCOUNTER — Other Ambulatory Visit: Payer: Self-pay | Admitting: Internal Medicine

## 2024-02-07 DIAGNOSIS — I25118 Atherosclerotic heart disease of native coronary artery with other forms of angina pectoris: Secondary | ICD-10-CM

## 2024-02-20 ENCOUNTER — Telehealth: Payer: Self-pay | Admitting: Internal Medicine

## 2024-02-20 NOTE — Telephone Encounter (Unsigned)
 Copied from CRM [id number]. Topic: Clinical - Medical Advice >> Feb 20, 2024 11:28 AM Elizebeth Brooking wrote: Reason for CRM: Patient called in stating his teeth has been bothering has seen a dentist and also been sent to a specialist , would like to know if there was anyway Dr.Jones can send in an antibiotic, because he thinks it could be a sinus infection

## 2024-02-20 NOTE — Telephone Encounter (Signed)
 Unable to reach patient. LMTRC. Patient will need to be seen, Dr Yetta Barre can not just send him in an antibiotic.

## 2024-02-21 ENCOUNTER — Other Ambulatory Visit: Payer: Self-pay | Admitting: Internal Medicine

## 2024-02-21 DIAGNOSIS — I10 Essential (primary) hypertension: Secondary | ICD-10-CM

## 2024-02-21 DIAGNOSIS — J0100 Acute maxillary sinusitis, unspecified: Secondary | ICD-10-CM | POA: Diagnosis not present

## 2024-02-21 NOTE — Telephone Encounter (Signed)
 Patient stated that he was seen in urgent care this morning.

## 2024-02-22 ENCOUNTER — Other Ambulatory Visit: Payer: Self-pay | Admitting: Internal Medicine

## 2024-02-22 DIAGNOSIS — I25118 Atherosclerotic heart disease of native coronary artery with other forms of angina pectoris: Secondary | ICD-10-CM

## 2024-02-22 DIAGNOSIS — E785 Hyperlipidemia, unspecified: Secondary | ICD-10-CM

## 2024-03-04 ENCOUNTER — Other Ambulatory Visit: Payer: Self-pay | Admitting: Internal Medicine

## 2024-03-04 DIAGNOSIS — M1A0790 Idiopathic chronic gout, unspecified ankle and foot, without tophus (tophi): Secondary | ICD-10-CM

## 2024-03-04 DIAGNOSIS — F325 Major depressive disorder, single episode, in full remission: Secondary | ICD-10-CM

## 2024-03-12 ENCOUNTER — Telehealth: Payer: Self-pay | Admitting: Internal Medicine

## 2024-03-12 ENCOUNTER — Encounter: Payer: Self-pay | Admitting: Internal Medicine

## 2024-03-12 ENCOUNTER — Ambulatory Visit (INDEPENDENT_AMBULATORY_CARE_PROVIDER_SITE_OTHER): Admitting: Internal Medicine

## 2024-03-12 VITALS — BP 136/96 | HR 58 | Temp 97.8°F | Resp 16 | Ht 73.0 in | Wt 213.6 lb

## 2024-03-12 DIAGNOSIS — R972 Elevated prostate specific antigen [PSA]: Secondary | ICD-10-CM

## 2024-03-12 DIAGNOSIS — R001 Bradycardia, unspecified: Secondary | ICD-10-CM | POA: Diagnosis not present

## 2024-03-12 DIAGNOSIS — I25118 Atherosclerotic heart disease of native coronary artery with other forms of angina pectoris: Secondary | ICD-10-CM

## 2024-03-12 DIAGNOSIS — K5904 Chronic idiopathic constipation: Secondary | ICD-10-CM | POA: Diagnosis not present

## 2024-03-12 DIAGNOSIS — E119 Type 2 diabetes mellitus without complications: Secondary | ICD-10-CM

## 2024-03-12 DIAGNOSIS — I10 Essential (primary) hypertension: Secondary | ICD-10-CM

## 2024-03-12 DIAGNOSIS — M1A0790 Idiopathic chronic gout, unspecified ankle and foot, without tophus (tophi): Secondary | ICD-10-CM | POA: Diagnosis not present

## 2024-03-12 DIAGNOSIS — E785 Hyperlipidemia, unspecified: Secondary | ICD-10-CM

## 2024-03-12 LAB — CBC WITH DIFFERENTIAL/PLATELET
Basophils Absolute: 0 10*3/uL (ref 0.0–0.1)
Basophils Relative: 0.4 % (ref 0.0–3.0)
Eosinophils Absolute: 0.4 10*3/uL (ref 0.0–0.7)
Eosinophils Relative: 8.8 % — ABNORMAL HIGH (ref 0.0–5.0)
HCT: 42.9 % (ref 39.0–52.0)
Hemoglobin: 14.3 g/dL (ref 13.0–17.0)
Lymphocytes Relative: 29.5 % (ref 12.0–46.0)
Lymphs Abs: 1.4 10*3/uL (ref 0.7–4.0)
MCHC: 33.3 g/dL (ref 30.0–36.0)
MCV: 90.5 fl (ref 78.0–100.0)
Monocytes Absolute: 0.5 10*3/uL (ref 0.1–1.0)
Monocytes Relative: 9.4 % (ref 3.0–12.0)
Neutro Abs: 2.6 10*3/uL (ref 1.4–7.7)
Neutrophils Relative %: 51.9 % (ref 43.0–77.0)
Platelets: 181 10*3/uL (ref 150.0–400.0)
RBC: 4.74 Mil/uL (ref 4.22–5.81)
RDW: 14.3 % (ref 11.5–15.5)
WBC: 4.9 10*3/uL (ref 4.0–10.5)

## 2024-03-12 LAB — MICROALBUMIN / CREATININE URINE RATIO
Creatinine,U: 33.1 mg/dL
Microalb Creat Ratio: UNDETERMINED mg/g (ref 0.0–30.0)
Microalb, Ur: <0.7 mg/dL

## 2024-03-12 LAB — BASIC METABOLIC PANEL WITH GFR
BUN: 17 mg/dL (ref 6–23)
CO2: 32 meq/L (ref 19–32)
Calcium: 10.1 mg/dL (ref 8.4–10.5)
Chloride: 104 meq/L (ref 96–112)
Creatinine, Ser: 1.12 mg/dL (ref 0.40–1.50)
GFR: 66.76 mL/min (ref >60.00)
Glucose, Bld: 97 mg/dL (ref 70–99)
Potassium: 4.6 meq/L (ref 3.5–5.1)
Sodium: 140 meq/L (ref 135–145)

## 2024-03-12 LAB — URINALYSIS, ROUTINE W REFLEX MICROSCOPIC
Bilirubin Urine: NEGATIVE
Hgb urine dipstick: NEGATIVE
Ketones, ur: NEGATIVE
Leukocytes,Ua: NEGATIVE
Nitrite: NEGATIVE
RBC / HPF: NONE SEEN (ref 0–?)
Specific Gravity, Urine: 1.01 (ref 1.000–1.030)
Total Protein, Urine: NEGATIVE
Urine Glucose: NEGATIVE
Urobilinogen, UA: 0.2 (ref 0.0–1.0)
WBC, UA: NONE SEEN (ref 0–?)
pH: 7 (ref 5.0–8.0)

## 2024-03-12 LAB — HEPATIC FUNCTION PANEL
ALT: 23 U/L (ref 0–53)
AST: 24 U/L (ref 0–37)
Albumin: 4.4 g/dL (ref 3.5–5.2)
Alkaline Phosphatase: 41 U/L (ref 39–117)
Bilirubin, Direct: 0.1 mg/dL (ref 0.0–0.3)
Total Bilirubin: 0.6 mg/dL (ref 0.2–1.2)
Total Protein: 6.7 g/dL (ref 6.0–8.3)

## 2024-03-12 LAB — HEMOGLOBIN A1C: Hgb A1c MFr Bld: 6.4 % (ref 4.6–6.5)

## 2024-03-12 LAB — LIPID PANEL
Cholesterol: 125 mg/dL (ref 0–200)
HDL: 41.2 mg/dL (ref >39.00)
LDL Cholesterol: 70 mg/dL (ref 0–99)
NonHDL: 84.18
Total CHOL/HDL Ratio: 3
Triglycerides: 69 mg/dL (ref 0.0–149.0)
VLDL: 13.8 mg/dL (ref 0.0–40.0)

## 2024-03-12 LAB — MAGNESIUM: Magnesium: 2.1 mg/dL (ref 1.5–2.5)

## 2024-03-12 LAB — URIC ACID: Uric Acid, Serum: 5.3 mg/dL (ref 4.0–7.8)

## 2024-03-12 LAB — TSH: TSH: 2.72 u[IU]/mL (ref 0.35–5.50)

## 2024-03-12 LAB — PSA: PSA: 5.24 ng/mL — ABNORMAL HIGH (ref 0.10–4.00)

## 2024-03-12 MED ORDER — ASPIRIN EC 81 MG PO TBEC: 81.0000 mg | DELAYED_RELEASE_TABLET | Freq: Every day | ORAL | 1 refills | Status: DC

## 2024-03-12 MED ORDER — ISOSORBIDE MONONITRATE ER 30 MG PO TB24: 30.0000 mg | ORAL_TABLET | Freq: Every day | ORAL | 1 refills | Status: DC

## 2024-03-12 MED ORDER — OLMESARTAN MEDOXOMIL 40 MG PO TABS: 40.0000 mg | ORAL_TABLET | Freq: Every day | ORAL | 1 refills | Status: DC

## 2024-03-12 NOTE — Patient Instructions (Signed)

## 2024-03-12 NOTE — Progress Notes (Signed)
 Subjective:  Patient ID: Shane Garrett, male    DOB: 12/13/1955  Age: 68 y.o. MRN: [id number]  CC: Hypertension, Hyperlipidemia, and Diabetes   HPI Shane Garrett presents for f/up  ---   Discussed the use of AI scribe software for clinical note transcription with the patient, who gave verbal consent to proceed.  History of Present Illness   Shane Garrett "Siegfried Dress" is a 68 year old male who presents for a follow-up visit.  He engages in physical activities such as pickleball and golf without experiencing any chest pain, shortness of breath, dizziness, or lightheadedness. He has not used nitroglycerin  recently but notes the need for a refill and plans to consult with his cardiologist.  He reports occasional constipation, describing it as 'hard to get out.' He has not been using any treatment for this issue but is considering taking Miralax.  He is a nonsmoker and nondrinker, though he occasionally consumes a glass of wine. No chest pain, shortness of breath, dizziness, lightheadedness, or symptoms related to low blood pressure.       Outpatient Medications Prior to Visit  Medication Sig Dispense Refill   allopurinol  (ZYLOPRIM ) 100 MG tablet Take 1 tablet by mouth once daily 90 tablet 0   citalopram  (CELEXA ) 40 MG tablet Take 1 tablet by mouth once daily 90 tablet 0   clonazePAM  (KLONOPIN ) 0.5 MG tablet TAKE 1 TABLET BY MOUTH ONCE DAILY AS NEEDED FOR ANXIETY 90 tablet 1   colchicine 0.6 MG tablet Take by mouth as needed.      ezetimibe  (ZETIA ) 10 MG tablet Take 1 tablet by mouth once daily 90 tablet 0   Insulin  Pen Needle 32G X 6 MM MISC 1 Act by Does not apply route once a week. 30 each 1   metFORMIN  (GLUCOPHAGE -XR) 750 MG 24 hr tablet Take 1 tablet (750 mg total) by mouth daily with breakfast. 90 tablet 0   metoprolol  succinate (TOPROL -XL) 50 MG 24 hr tablet TAKE 1 TABLET BY MOUTH ONCE DAILY WITH MEALS 90 tablet 0   nitroGLYCERIN  (NITROSTAT ) 0.4 MG SL tablet Place 1 tablet (0.4 mg  total) under the tongue every 5 (five) minutes as needed for chest pain. 75 tablet 0   OZEMPIC , 2 MG/DOSE, 8 MG/3ML SOPN INJECT 2MG  SUBCUTANEOUSLY ONCE A WEEK 9 mL 0   rosuvastatin  (CRESTOR ) 40 MG tablet Take 1 tablet by mouth once daily 90 tablet 0   aspirin  EC 81 MG tablet Take by mouth daily.     isosorbide  mononitrate (IMDUR ) 30 MG 24 hr tablet Take 1 tablet (30 mg total) by mouth daily. 30 tablet 0   olmesartan  (BENICAR ) 20 MG tablet Take 1 tablet by mouth once daily 90 tablet 0   methocarbamol  (ROBAXIN ) 500 MG tablet Take 1 tablet (500 mg total) by mouth 4 (four) times daily. 30 tablet 0   No facility-administered medications prior to visit.    ROS Review of Systems  Constitutional: Negative.  Negative for diaphoresis and fatigue.  HENT: Negative.    Eyes: Negative.   Respiratory: Negative.  Negative for cough, chest tightness, shortness of breath and wheezing.   Cardiovascular:  Negative for chest pain, palpitations and leg swelling.  Gastrointestinal:  Positive for constipation. Negative for abdominal pain, blood in stool, diarrhea, nausea and vomiting.  Endocrine: Negative.   Genitourinary:  Negative for difficulty urinating, dysuria and hematuria.  Musculoskeletal:  Negative for arthralgias and myalgias.  Skin: Negative.  Negative for color change and rash.  Allergic/Immunologic: Negative.  Neurological: Negative.  Negative for dizziness and weakness.  Hematological:  Negative for adenopathy. Does not bruise/bleed easily.  Psychiatric/Behavioral: Negative.      Objective:  BP (!) 136/96 (BP Location: Left Arm, Patient Position: Sitting, Cuff Size: Normal)   Pulse (!) 58   Temp 97.8 F (36.6 C) (Oral)   Resp 16   Ht 6\' 1"  (1.854 m)   Wt 213 lb 9.6 oz (96.9 kg)   SpO2 98%   BMI 28.18 kg/m   BP Readings from Last 3 Encounters:  03/12/24 (!) 136/96  09/18/23 (!) 136/100  01/24/23 (!) 130/90    Wt Readings from Last 3 Encounters:  03/12/24 213 lb 9.6 oz (96.9 kg)   09/18/23 215 lb 3.2 oz (97.6 kg)  09/12/23 220 lb (99.8 kg)    Physical Exam Vitals reviewed.  Constitutional:      Appearance: Normal appearance.  HENT:     Nose: Nose normal.     Mouth/Throat:     Mouth: Mucous membranes are moist.  Eyes:     General: No scleral icterus.    Conjunctiva/sclera: Conjunctivae normal.  Neck:     Thyroid : No thyroid  mass, thyromegaly or thyroid  tenderness.  Cardiovascular:     Rate and Rhythm: Regular rhythm. Bradycardia present.     Heart sounds: Normal heart sounds, S1 normal and S2 normal. No murmur heard.    Comments: EKG- SB, 56 bpm No LVH, Q waves, or ST/T wave changes  Pulmonary:     Effort: Pulmonary effort is normal.     Breath sounds: No stridor. No wheezing, rhonchi or rales.  Abdominal:     General: Abdomen is flat.     Palpations: There is no mass.     Tenderness: There is no abdominal tenderness. There is no guarding.     Hernia: No hernia is present.  Musculoskeletal:     Cervical back: Neck supple.     Right lower leg: No edema.     Left lower leg: No edema.  Lymphadenopathy:     Cervical: No cervical adenopathy.  Skin:    General: Skin is warm and dry.  Neurological:     General: No focal deficit present.     Mental Status: He is alert. Mental status is at baseline.  Psychiatric:        Mood and Affect: Mood normal.        Behavior: Behavior normal.     Lab Results  Component Value Date   WBC 4.9 03/12/2024   HGB 14.3 03/12/2024   HCT 42.9 03/12/2024   PLT 181.0 03/12/2024   GLUCOSE 97 03/12/2024   CHOL 125 03/12/2024   TRIG 69.0 03/12/2024   HDL 41.20 03/12/2024   LDLDIRECT 67.0 04/14/2020   LDLCALC 70 03/12/2024   ALT 23 03/12/2024   AST 24 03/12/2024   NA 140 03/12/2024   K 4.6 03/12/2024   CL 104 03/12/2024   CREATININE 1.12 03/12/2024   BUN 17 03/12/2024   CO2 32 03/12/2024   TSH 2.72 03/12/2024   PSA 5.24 (H) 03/12/2024   HGBA1C 6.4 03/12/2024   MICROALBUR <0.7 03/12/2024    CT ANGIO  CHEST AORTA W/ & OR WO/CM & GATING ([place] ONLY) Result Date: 01/01/2022 CLINICAL DATA:  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 EXAM: CT ANGIOGRAPHY CHEST WITH CONTRAST TECHNIQUE: Multidetector CT imaging of the chest was performed using the standard protocol during bolus administration of intravenous contrast. Multiplanar CT image reconstructions and  MIPs were obtained to evaluate the vascular anatomy. RADIATION DOSE REDUCTION: This exam was performed according to the departmental dose-optimization program which includes automated exposure control, adjustment of the mA and/or kV according to patient size and/or use of iterative reconstruction technique. CONTRAST:  75mL OMNIPAQUE  IOHEXOL  350 MG/ML SOLN COMPARISON:  Report from an echocardiogram dated 12/18/2021 FINDINGS: Cardiovascular: Caliber of the aortic root is top-normal measuring 4 cm at the level of the sinuses of Valsalva. The ascending thoracic aorta is top-normal in caliber and not overtly aneurysmal measuring 3.9 cm in greatest estimated diameter. The thoracic aorta is moderately tortuous. The proximal arch measures 3.4 cm and the distal arch 2.8 cm. The descending thoracic aorta measures 2.5 cm. No evidence of aortic dissection. Proximal great vessels demonstrate normal patency and normal branching anatomy. There is evidence of prior CABG. The heart size is normal. No pericardial fluid identified. Central pulmonary arteries are normal in caliber. Mediastinum/Nodes: No enlarged mediastinal, hilar, or axillary lymph nodes. The trachea, and esophagus demonstrate no significant findings. Low-density mid right thyroid  nodule measures approximately 14 mm in greatest diameter. Lungs/Pleura: There is no evidence of pulmonary edema, consolidation, pneumothorax, nodule or pleural fluid. Upper Abdomen: No acute abnormality. Musculoskeletal: No chest wall abnormality. No acute or significant osseous findings. Review of  the MIP images confirms the above findings. IMPRESSION: 1. No overt aneurysmal dilatation of the thoracic aorta. The aortic root and ascending thoracic aorta are top-normal in caliber with the root measuring 4 cm and the ascending thoracic aorta at 3.9 cm. The thoracic aorta is also moderately tortuous. 2. 1.4 cm right thyroid  nodule. 1.4 cm incidental right thyroid  nodule. No follow-up imaging is recommended. Reference: J Am Coll Radiol. 2015 Feb;12(2): 143-50 Electronically Signed   By: Erica Hau M.D.   On: 01/01/2022 09:33    Assessment & Plan:   Primary hypertension- He has not achieved his BP goal. EKG is negative for LVH. Will increase the ARB dose. -     Urinalysis, Routine w reflex microscopic; Future -     Hepatic function panel; Future -     Basic metabolic panel with GFR; Future -     CBC with Differential/Platelet; Future -     EKG 12-Lead -     TSH; Future -     Olmesartan  Medoxomil; Take 1 tablet (40 mg total) by mouth daily.  Dispense: 90 tablet; Refill: 1  Idiopathic chronic gout of foot without tophus, unspecified laterality -     Basic metabolic panel with GFR; Future -     Uric acid; Future  Type 2 diabetes mellitus without complication, without long-term current use of insulin  (HCC) -     Urinalysis, Routine w reflex microscopic; Future -     Hemoglobin A1c; Future -     Basic metabolic panel with GFR; Future -     Microalbumin / creatinine urine ratio; Future -     HM Diabetes Foot Exam  Hyperlipidemia with target LDL less than 70- LDL goal achieved. Doing well on the statin  -     Lipid panel; Future -     Hepatic function panel; Future -     TSH; Future  Elevated PSA -     PSA; Future  Bradycardia -     EKG 12-Lead -     TSH; Future -     Ambulatory referral to Cardiology  Chronic idiopathic constipation -     Magnesium; Future -     TSH; Future  Coronary artery disease of native artery of native heart with stable angina pectoris (HCC) -      Ambulatory referral to Cardiology -     Aspirin  EC; Take 1 tablet (81 mg total) by mouth daily.  Dispense: 90 tablet; Refill: 1 -     Isosorbide  Mononitrate ER; Take 1 tablet (30 mg total) by mouth daily.  Dispense: 90 tablet; Refill: 1     Follow-up: Return in about 6 months (around 09/11/2024).  Sandra Crouch, MD

## 2024-03-13 ENCOUNTER — Encounter: Payer: Self-pay | Admitting: Internal Medicine

## 2024-03-13 ENCOUNTER — Other Ambulatory Visit: Payer: Self-pay | Admitting: Internal Medicine

## 2024-03-20 NOTE — Telephone Encounter (Signed)
 Copied from CRM [id number]. Topic: Clinical - Prescription Issue >> Mar 20, 2024  9:38 AM Ovid Blow wrote: Reason for CRM: Patient stated that he have not heard heard anything about his medication OZEMPIC , 2 MG/DOSE, 8 MG/3ML SOPN. Prescription was ordered on 4/17 but is pending. Please contact patient regarding this matter.

## 2024-03-24 NOTE — Telephone Encounter (Signed)
 Rx sent

## 2024-04-22 ENCOUNTER — Encounter: Payer: Self-pay | Admitting: Physician Assistant

## 2024-04-22 ENCOUNTER — Ambulatory Visit: Attending: Physician Assistant | Admitting: Physician Assistant

## 2024-04-22 VITALS — BP 116/84 | HR 60 | Ht 73.0 in | Wt 213.0 lb

## 2024-04-22 DIAGNOSIS — E119 Type 2 diabetes mellitus without complications: Secondary | ICD-10-CM | POA: Diagnosis not present

## 2024-04-22 DIAGNOSIS — I2510 Atherosclerotic heart disease of native coronary artery without angina pectoris: Secondary | ICD-10-CM | POA: Diagnosis not present

## 2024-04-22 DIAGNOSIS — I10 Essential (primary) hypertension: Secondary | ICD-10-CM | POA: Diagnosis not present

## 2024-04-22 DIAGNOSIS — E785 Hyperlipidemia, unspecified: Secondary | ICD-10-CM | POA: Diagnosis not present

## 2024-04-22 DIAGNOSIS — I25810 Atherosclerosis of coronary artery bypass graft(s) without angina pectoris: Secondary | ICD-10-CM

## 2024-04-22 MED ORDER — NITROGLYCERIN 0.4 MG SL SUBL: 0.4000 mg | SUBLINGUAL_TABLET | SUBLINGUAL | 3 refills | Status: AC | PRN

## 2024-04-22 NOTE — Patient Instructions (Signed)
 Medication Instructions:  NO CHANGES *If you need a refill on your cardiac medications before your next appointment, please call your pharmacy*  Lab Work: NO LABS If you have labs (blood work) drawn today and your tests are completely normal, you will receive your results only by: MyChart Message (if you have MyChart) OR A paper copy in the mail If you have any lab test that is abnormal or we need to change your treatment, we will call you to review the results.  Testing/Procedures: NO TESTING  Follow-Up: At [facility], you and your health needs are our priority.  As part of our continuing mission to provide you with exceptional heart care, our providers are all part of one team.  This team includes your primary Cardiologist (physician) and Advanced Practice Providers or APPs (Physician Assistants and Nurse Practitioners) who all work together to provide you with the care you need, when you need it.  Your next appointment:   1 year(s)  Provider:   Antoinette Batman, MD

## 2024-04-22 NOTE — Progress Notes (Unsigned)
 Cardiology Office Note:  .   Date:  04/24/2024  ID:  Shane Garrett, DOB Mar 03, 1956, MRN [id number] PCP: Arcadio Knuckles, MD  [facility] Providers Cardiologist:  Antoinette Batman, MD     History of Present Illness: Shane Garrett is a 68 y.o. male with PMH of CAD s/p 2v CABG 2012, HTN, HLD, anxiety and DM II.  He had two-vessel CABG in [place]  around 2012.  He was previously followed by cardiologist in [place]  prior to transfer care to Dr. Abel Hoe in November 2021.  He has not had heart cath since his bypass surgery.  He was admitted to [facility] in May 2020 with chest pain and had a new normal nuclear stress test then.  Echocardiogram in January 2023 showed EF 60 to 65%, mild MR, mild dilatation of the ascending aorta.  Chest CTA in February 2023 showed 3.9 cm ascending thoracic aorta.  He had some chest pain in the fall 2023 when he held his Imdur .  Patient was last seen by Dr. Abel Hoe in January 2024 at which time he was retired and was was Naval architect and fishing. He has chronically elevated PSA and followed by both IM and urology (Dr. Derrick Fling).   Patient presents today for follow-up.  He denies any recent chest pain or shortness of breath.  He has been doing well from the cardiac perspective.  I will refill his nitroglycerin .  He can follow-up with Dr. Abel Hoe in 1 year.  ROS:   He denies chest pain, palpitations, dyspnea, pnd, orthopnea, n, v, dizziness, syncope, edema, weight gain, or early satiety. All other systems reviewed and are otherwise negative except as noted above.    Studies Reviewed: Aaron Aas   EKG Interpretation Date/Time:  Wednesday Apr 22 2024 11:39:17 EDT Ventricular Rate:  56 PR Interval:  210 QRS Duration:  96 QT Interval:  448 QTC Calculation: 432 R Axis:   6  Text Interpretation: Sinus bradycardia with 1st degree A-V block When compared with ECG of 23-Aug-2003 23:10, PR interval has increased Confirmed by Ervin Heath [phone])  on 04/22/2024 11:53:25 AM    Cardiac Studies & Procedures   ______________________________________________________________________________________________     ECHOCARDIOGRAM  ECHOCARDIOGRAM COMPLETE 12/18/2021  Narrative ECHOCARDIOGRAM REPORT    Patient Name:   Shane Garrett Date of Exam: 12/18/2021 Medical Rec #:  [id number]      Height:       73.0 in Accession #:    [id number]     Weight:       257.0 lb Date of Birth:  November 07, 1956      BSA:          2.394 m Patient Age:    65 years       BP:           120/90 mmHg Patient Gender: M              HR:           50 bpm. Exam Location:  [address]  Procedure: 2D Echo, Cardiac Doppler and Color Doppler  Indications:    I25.10 Coronary artery disease. I77.810 Aortic Ectasia  History:        Patient has no prior history of Echocardiogram examinations. CAD, Prior CABG; Risk Factors:Hypertension, Diabetes and Dyslipidemia.  Sonographer:    Cheri Coria Rodgers-Jones RDCS Referring Phys: [id number] DAYNA N DUNN  IMPRESSIONS   1. Left ventricular ejection fraction, by estimation, is 60 to 65%. The left  ventricle has normal function. The left ventricle has no regional wall motion abnormalities. Left ventricular diastolic parameters are indeterminate. 2. Right ventricular systolic function is normal. The right ventricular size is normal. 3. Left atrial size was mildly dilated. 4. The mitral valve is normal in structure. Mild mitral valve regurgitation. 5. The aortic valve is normal in structure. Aortic valve regurgitation is not visualized. 6. Aortic dilatation noted. There is mild dilatation of the ascending aorta, measuring 43 mm. 7. The inferior vena cava is normal in size with greater than 50% respiratory variability, suggesting right atrial pressure of 3 mmHg.  FINDINGS Left Ventricle: Left ventricular ejection fraction, by estimation, is 60 to 65%. The left ventricle has normal function. The left ventricle has no regional wall motion  abnormalities. The left ventricular internal cavity size was normal in size. There is no left ventricular hypertrophy. Left ventricular diastolic parameters are indeterminate.  Right Ventricle: The right ventricular size is normal. Right vetricular wall thickness was not assessed. Right ventricular systolic function is normal.  Left Atrium: Left atrial size was mildly dilated.  Right Atrium: Right atrial size was normal in size.  Pericardium: There is no evidence of pericardial effusion.  Mitral Valve: The mitral valve is normal in structure. Mild mitral valve regurgitation.  Tricuspid Valve: The tricuspid valve is normal in structure. Tricuspid valve regurgitation is trivial.  Aortic Valve: The aortic valve is normal in structure. Aortic valve regurgitation is not visualized.  Pulmonic Valve: The pulmonic valve was normal in structure. Pulmonic valve regurgitation is trivial.  Aorta: The aortic root is normal in size and structure and aortic dilatation noted. There is mild dilatation of the ascending aorta, measuring 43 mm.  Venous: The inferior vena cava is normal in size with greater than 50% respiratory variability, suggesting right atrial pressure of 3 mmHg.  IAS/Shunts: No atrial level shunt detected by color flow Doppler.   LEFT VENTRICLE PLAX 2D LVIDd:         4.70 cm   Diastology LVIDs:         3.10 cm   LV e' medial:    8.21 cm/s LV PW:         1.30 cm   LV E/e' medial:  6.2 LV IVS:        1.30 cm   LV e' lateral:   9.70 cm/s LVOT diam:     2.30 cm   LV E/e' lateral: 5.2 LV SV:         89 LV SV Index:   37 LVOT Area:     4.15 cm   RIGHT VENTRICLE RV Basal diam:  4.90 cm RV S prime:     13.35 cm/s TAPSE (M-mode): 2.2 cm  LEFT ATRIUM             Index        RIGHT ATRIUM           Index LA diam:        5.00 cm 2.09 cm/m   RA Area:     18.10 cm LA Vol (A2C):   84.6 ml 35.34 ml/m  RA Volume:   47.60 ml  19.89 ml/m LA Vol (A4C):   86.6 ml 36.18 ml/m LA  Biplane Vol: 85.7 ml 35.80 ml/m AORTIC VALVE LVOT Vmax:   95.25 cm/s LVOT Vmean:  63.200 cm/s LVOT VTI:    0.215 m  AORTA Ao Root diam: 4.30 cm Ao Asc diam:  4.30 cm  MITRAL VALVE  TRICUSPID VALVE MV Area (PHT): 3.21 cm    TR Peak grad:   20.4 mmHg MV Decel Time: 236 msec    TR Vmax:        226.00 cm/s MV E velocity: 50.70 cm/s MV A velocity: 57.70 cm/s  SHUNTS MV E/A ratio:  0.88        Systemic VTI:  0.22 m Systemic Diam: 2.30 cm  Ola Berger MD Electronically signed by Ola Berger MD Signature Date/Time: 12/18/2021/4:26:43 PM    Final          ______________________________________________________________________________________________      Risk Assessment/Calculations:            Physical Exam:   VS:  BP 116/84 (BP Location: Right Arm, Patient Position: Sitting, Cuff Size: Large)   Pulse 60   Ht 6\' 1"  (1.854 m)   Wt 213 lb (96.6 kg)   SpO2 97%   BMI 28.10 kg/m    Wt Readings from Last 3 Encounters:  04/22/24 213 lb (96.6 kg)  03/12/24 213 lb 9.6 oz (96.9 kg)  09/18/23 215 lb 3.2 oz (97.6 kg)    GEN: Well nourished, well developed in no acute distress NECK: No JVD; No carotid bruits CARDIAC: RRR, no murmurs, rubs, gallops RESPIRATORY:  Clear to auscultation without rales, wheezing or rhonchi  ABDOMEN: Soft, non-tender, non-distended EXTREMITIES:  No edema; No deformity   ASSESSMENT AND PLAN: .    CAD s/p 2v CABG 2012: Denies any chest pain.  Continue aspirin , Zetia , metoprolol  and Imdur .  Denies any recent chest pain.  Hypertension: Blood pressure stable  Hyperlipidemia: On Zetia  and Crestor   DM2: Managed by primary care provider       Dispo: Follow-up with Dr. Abel Hoe in 1 year  Signed, Renate Danh, [place]

## 2024-04-30 DIAGNOSIS — R972 Elevated prostate specific antigen [PSA]: Secondary | ICD-10-CM | POA: Diagnosis not present

## 2024-05-04 ENCOUNTER — Other Ambulatory Visit: Payer: Self-pay | Admitting: Internal Medicine

## 2024-05-04 DIAGNOSIS — I25118 Atherosclerotic heart disease of native coronary artery with other forms of angina pectoris: Secondary | ICD-10-CM

## 2024-05-07 DIAGNOSIS — R3912 Poor urinary stream: Secondary | ICD-10-CM | POA: Diagnosis not present

## 2024-05-07 DIAGNOSIS — R972 Elevated prostate specific antigen [PSA]: Secondary | ICD-10-CM | POA: Diagnosis not present

## 2024-05-07 DIAGNOSIS — N401 Enlarged prostate with lower urinary tract symptoms: Secondary | ICD-10-CM | POA: Diagnosis not present

## 2024-05-13 ENCOUNTER — Ambulatory Visit: Admitting: Physician Assistant

## 2024-05-25 ENCOUNTER — Other Ambulatory Visit: Payer: Self-pay | Admitting: Internal Medicine

## 2024-05-25 DIAGNOSIS — I10 Essential (primary) hypertension: Secondary | ICD-10-CM

## 2024-05-28 ENCOUNTER — Other Ambulatory Visit: Payer: Self-pay | Admitting: Internal Medicine

## 2024-05-28 DIAGNOSIS — F325 Major depressive disorder, single episode, in full remission: Secondary | ICD-10-CM

## 2024-05-28 DIAGNOSIS — M1A0790 Idiopathic chronic gout, unspecified ankle and foot, without tophus (tophi): Secondary | ICD-10-CM

## 2024-06-08 ENCOUNTER — Ambulatory Visit (INDEPENDENT_AMBULATORY_CARE_PROVIDER_SITE_OTHER): Admitting: Internal Medicine

## 2024-06-08 ENCOUNTER — Ambulatory Visit: Payer: Self-pay | Admitting: Internal Medicine

## 2024-06-08 ENCOUNTER — Encounter: Payer: Self-pay | Admitting: Internal Medicine

## 2024-06-08 VITALS — BP 132/84 | HR 65 | Temp 98.3°F | Resp 16 | Ht 73.0 in | Wt 212.4 lb

## 2024-06-08 DIAGNOSIS — R221 Localized swelling, mass and lump, neck: Secondary | ICD-10-CM | POA: Diagnosis not present

## 2024-06-08 DIAGNOSIS — Z7985 Long-term (current) use of injectable non-insulin antidiabetic drugs: Secondary | ICD-10-CM

## 2024-06-08 DIAGNOSIS — I10 Essential (primary) hypertension: Secondary | ICD-10-CM

## 2024-06-08 DIAGNOSIS — E119 Type 2 diabetes mellitus without complications: Secondary | ICD-10-CM

## 2024-06-08 DIAGNOSIS — L040 Acute lymphadenitis of face, head and neck: Secondary | ICD-10-CM | POA: Insufficient documentation

## 2024-06-08 LAB — CBC WITH DIFFERENTIAL/PLATELET
Basophils Absolute: 0.1 K/uL (ref 0.0–0.1)
Basophils Relative: 1 % (ref 0.0–3.0)
Eosinophils Absolute: 0.5 K/uL (ref 0.0–0.7)
Eosinophils Relative: 8.2 % — ABNORMAL HIGH (ref 0.0–5.0)
HCT: 41.3 % (ref 39.0–52.0)
Hemoglobin: 14 g/dL (ref 13.0–17.0)
Lymphocytes Relative: 25.8 % (ref 12.0–46.0)
Lymphs Abs: 1.6 K/uL (ref 0.7–4.0)
MCHC: 33.9 g/dL (ref 30.0–36.0)
MCV: 88.6 fl (ref 78.0–100.0)
Monocytes Absolute: 0.6 K/uL (ref 0.1–1.0)
Monocytes Relative: 9.5 % (ref 3.0–12.0)
Neutro Abs: 3.4 K/uL (ref 1.4–7.7)
Neutrophils Relative %: 55.5 % (ref 43.0–77.0)
Platelets: 169 K/uL (ref 150.0–400.0)
RBC: 4.66 Mil/uL (ref 4.22–5.81)
RDW: 14.2 % (ref 11.5–15.5)
WBC: 6.2 K/uL (ref 4.0–10.5)

## 2024-06-08 LAB — BASIC METABOLIC PANEL WITH GFR
BUN: 18 mg/dL (ref 6–23)
CO2: 28 meq/L (ref 19–32)
Calcium: 10 mg/dL (ref 8.4–10.5)
Chloride: 105 meq/L (ref 96–112)
Creatinine, Ser: 1.23 mg/dL (ref 0.40–1.50)
GFR: 59.56 mL/min — ABNORMAL LOW (ref >60.00)
Glucose, Bld: 82 mg/dL (ref 70–99)
Potassium: 4.3 meq/L (ref 3.5–5.1)
Sodium: 139 meq/L (ref 135–145)

## 2024-06-08 LAB — C-REACTIVE PROTEIN: CRP: <1 mg/dL (ref 0.5–20.0)

## 2024-06-08 LAB — HEMOGLOBIN A1C: Hgb A1c MFr Bld: 6.5 % (ref 4.6–6.5)

## 2024-06-08 NOTE — Patient Instructions (Signed)
 Lymphadenopathy  Lymphadenopathy is when your lymph glands are swollen or larger than normal.  Lymph glands, also called lymph nodes, are clumps of tissue. They filter germs and waste from tissues in your body to your bloodstream. They're part of your body's defense system, or immune system. Lymphadenopathy has different causes, like infection, autoimmune disease, and cancer. Lymphadenopathy can happen wherever you have lymph nodes. The type you have depends on which nodes it's in, such as: Cervical lymphadenopathy. This is in the neck. Mediastinal lymphadenopathy. This is in the chest. Hilar lymphadenopathy. This is in the lungs. Axillary lymphadenopathy. This is in the armpits. Inguinal lymphadenopathy. This is in the groin. Sometimes, fluid and cells that fight infection build up in your lymph nodes. This happens when your immune system reacts to germs or other substances that get into your body. This makes lymph nodes swell and get bigger. Treatment is based on what's thought to be the cause. Sometimes, lymph nodes don't go back to normal size after treatment. If yours don't, your health care provider may order tests to help learn why your glands are still swollen and big. Follow these instructions at home:  Take over-the-counter and prescription medicines only as told by your provider. If you were prescribed antibiotics, do not stop using them, even if you start to feel better. If told, apply heat to swollen lymph nodes as told by your provider. Use the heat source that your provider recommends, such as a moist heat pack or a heating pad. Place a towel between your skin and the heat source. Leave the heat on only for the time told by your provider to avoid injury. If your skin turns bright red, remove the heat right away to prevent burns. The risk of burns is higher if you cannot feel pain, heat, or cold. Check your swollen lymph nodes every day for changes. Check other places where you have  lymph nodes as told. Check for changes such as: More swelling. Sudden growth in size. Redness or pain. Hardness. Contact a health care provider if: You have lymph nodes that: Are still swollen after 2 weeks. Have gotten bigger all of a sudden or the swelling spreads. Are red, painful, or hard. Fluid leaks from the skin near a swollen lymph node. You get a fever, chills, or night sweats. You feel tired. You have a sore throat. Your abdomen hurts. You lose weight without trying. This information is not intended to replace advice given to you by your health care provider. Make sure you discuss any questions you have with your health care provider. Document Revised: 02/06/2023 Document Reviewed: 02/06/2023 Elsevier Patient Education  2024 [employer].

## 2024-06-08 NOTE — Progress Notes (Signed)
 Subjective:  Patient ID: Shane Garrett, male    DOB: 06-19-1956  Age: 68 y.o. MRN: [id number]  CC: Neck Swelling (For a couple months. No pain and its on the left side of his neck. ), Ear Pain (Right ear is tender. ), Hypertension, and Diabetes   HPI Shane Garrett presents for f/up ----  Discussed the use of AI scribe software for clinical note transcription with the patient, who gave verbal consent to proceed.  History of Present Illness   Shane Garrett is a 68 year old male who presents with swelling in the gland under his left chin and sensitivity in his right ear.  He has experienced swelling in the gland under his left chin for several months. The swelling fluctuates in size, sometimes increasing and sometimes decreasing. Currently, the swelling is present but only slightly noticeable.  He also experiences sensitivity in his right ear, accompanied by mild pain. There are no issues with his left ear. No difficulty swallowing, coughing, or pain in other lymph nodes. No night sweats, fevers, or chills.  He is concerned that his symptoms might be related to his use of Ozempic , although he is unsure and has not heard of such side effects.       Outpatient Medications Prior to Visit  Medication Sig Dispense Refill   allopurinol  (ZYLOPRIM ) 100 MG tablet Take 1 tablet by mouth once daily 90 tablet 0   aspirin  EC 81 MG tablet Take 1 tablet (81 mg total) by mouth daily. 90 tablet 1   citalopram  (CELEXA ) 40 MG tablet Take 1 tablet by mouth once daily 90 tablet 0   clonazePAM  (KLONOPIN ) 0.5 MG tablet TAKE 1 TABLET BY MOUTH ONCE DAILY AS NEEDED FOR ANXIETY 90 tablet 1   colchicine 0.6 MG tablet Take by mouth as needed.      ezetimibe  (ZETIA ) 10 MG tablet Take 1 tablet by mouth once daily 90 tablet 0   Insulin  Pen Needle 32G X 6 MM MISC 1 Act by Does not apply route once a week. 30 each 1   isosorbide  mononitrate (IMDUR ) 30 MG 24 hr tablet Take 1 tablet (30 mg total) by mouth daily.  90 tablet 1   metFORMIN  (GLUCOPHAGE -XR) 750 MG 24 hr tablet Take 1 tablet (750 mg total) by mouth daily with breakfast. 90 tablet 0   metoprolol  succinate (TOPROL -XL) 50 MG 24 hr tablet TAKE 1 TABLET BY MOUTH ONCE DAILY WITH A MEAL 90 tablet 0   nitroGLYCERIN  (NITROSTAT ) 0.4 MG SL tablet Place 1 tablet (0.4 mg total) under the tongue every 5 (five) minutes as needed for chest pain. 25 tablet 3   olmesartan  (BENICAR ) 40 MG tablet Take 1 tablet (40 mg total) by mouth daily. 90 tablet 1   OZEMPIC , 2 MG/DOSE, 8 MG/3ML SOPN INJECT 2 MG SUBCUTANEOUSLY ONCE A WEEK. 9 mL 0   rosuvastatin  (CRESTOR ) 40 MG tablet Take 1 tablet by mouth once daily 90 tablet 0   No facility-administered medications prior to visit.    ROS Review of Systems  Constitutional:  Negative for appetite change, chills, diaphoresis, fatigue and fever.  HENT:  Positive for ear pain. Negative for sore throat and trouble swallowing.   Respiratory:  Negative for cough, chest tightness, shortness of breath and wheezing.   Cardiovascular:  Negative for chest pain, palpitations and leg swelling.  Gastrointestinal: Negative.  Negative for abdominal pain, constipation, diarrhea, nausea and vomiting.  Endocrine: Negative.   Genitourinary: Negative.   Musculoskeletal: Negative.  Negative for arthralgias.  Skin: Negative.  Negative for color change and rash.  Neurological: Negative.  Negative for dizziness.  Hematological:  Positive for adenopathy. Does not bruise/bleed easily.  Psychiatric/Behavioral: Negative.      Objective:  BP 132/84 (BP Location: Left Arm, Patient Position: Sitting, Cuff Size: Normal)   Pulse 65   Temp 98.3 F (36.8 C) (Oral)   Resp 16   Ht 6' 1 (1.854 m)   Wt 212 lb 6.4 oz (96.3 kg)   SpO2 97%   BMI 28.02 kg/m   BP Readings from Last 3 Encounters:  06/08/24 132/84  04/22/24 116/84  03/12/24 (!) 136/96    Wt Readings from Last 3 Encounters:  06/08/24 212 lb 6.4 oz (96.3 kg)  04/22/24 213 lb (96.6  kg)  03/12/24 213 lb 9.6 oz (96.9 kg)    Physical Exam Vitals reviewed.  HENT:     Nose: Nose normal.     Mouth/Throat:     Mouth: Mucous membranes are moist.  Eyes:     General: No scleral icterus.    Pupils: Pupils are equal, round, and reactive to light.  Neck:     Thyroid : No thyroid  mass, thyromegaly or thyroid  tenderness.   Cardiovascular:     Rate and Rhythm: Normal rate and regular rhythm.     Heart sounds: No murmur heard. Pulmonary:     Breath sounds: No stridor. No wheezing, rhonchi or rales.  Abdominal:     General: Abdomen is flat.     Palpations: There is no mass.     Tenderness: There is no abdominal tenderness. There is no guarding.     Hernia: No hernia is present.  Musculoskeletal:        General: Normal range of motion.     Cervical back: Neck supple. No tenderness.     Right lower leg: No edema.     Left lower leg: No edema.  Lymphadenopathy:     Cervical: Cervical adenopathy present.  Skin:    General: Skin is warm and dry.  Neurological:     General: No focal deficit present.     Mental Status: He is alert.  Psychiatric:        Mood and Affect: Mood normal.        Behavior: Behavior normal.     Lab Results  Component Value Date   WBC 6.2 06/08/2024   HGB 14.0 06/08/2024   HCT 41.3 06/08/2024   PLT 169.0 06/08/2024   GLUCOSE 82 06/08/2024   CHOL 125 03/12/2024   TRIG 69.0 03/12/2024   HDL 41.20 03/12/2024   LDLDIRECT 67.0 04/14/2020   LDLCALC 70 03/12/2024   ALT 23 03/12/2024   AST 24 03/12/2024   NA 139 06/08/2024   K 4.3 06/08/2024   CL 105 06/08/2024   CREATININE 1.23 06/08/2024   BUN 18 06/08/2024   CO2 28 06/08/2024   TSH 2.72 03/12/2024   PSA 5.24 (H) 03/12/2024   HGBA1C 6.5 06/08/2024   MICROALBUR <0.7 03/12/2024    CT ANGIO CHEST AORTA W/ & OR WO/CM & GATING ([facility] ONLY) Result Date: 01/01/2022 CLINICAL DATA:  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 EXAM: CT ANGIOGRAPHY  CHEST WITH CONTRAST TECHNIQUE: Multidetector CT imaging of the chest was performed using the standard protocol during bolus administration of intravenous contrast. Multiplanar CT image reconstructions and MIPs were obtained to evaluate the vascular anatomy. RADIATION DOSE REDUCTION: This exam was performed according to the departmental  dose-optimization program which includes automated exposure control, adjustment of the mA and/or kV according to patient size and/or use of iterative reconstruction technique. CONTRAST:  75mL OMNIPAQUE  IOHEXOL  350 MG/ML SOLN COMPARISON:  Report from an echocardiogram dated 12/18/2021 FINDINGS: Cardiovascular: Caliber of the aortic root is top-normal measuring 4 cm at the level of the sinuses of Valsalva. The ascending thoracic aorta is top-normal in caliber and not overtly aneurysmal measuring 3.9 cm in greatest estimated diameter. The thoracic aorta is moderately tortuous. The proximal arch measures 3.4 cm and the distal arch 2.8 cm. The descending thoracic aorta measures 2.5 cm. No evidence of aortic dissection. Proximal great vessels demonstrate normal patency and normal branching anatomy. There is evidence of prior CABG. The heart size is normal. No pericardial fluid identified. Central pulmonary arteries are normal in caliber. Mediastinum/Nodes: No enlarged mediastinal, hilar, or axillary lymph nodes. The trachea, and esophagus demonstrate no significant findings. Low-density mid right thyroid  nodule measures approximately 14 mm in greatest diameter. Lungs/Pleura: There is no evidence of pulmonary edema, consolidation, pneumothorax, nodule or pleural fluid. Upper Abdomen: No acute abnormality. Musculoskeletal: No chest wall abnormality. No acute or significant osseous findings. Review of the MIP images confirms the above findings. IMPRESSION: 1. No overt aneurysmal dilatation of the thoracic aorta. The aortic root and ascending thoracic aorta are top-normal in caliber with the  root measuring 4 cm and the ascending thoracic aorta at 3.9 cm. The thoracic aorta is also moderately tortuous. 2. 1.4 cm right thyroid  nodule. 1.4 cm incidental right thyroid  nodule. No follow-up imaging is recommended. Reference: J Am Coll Radiol. 2015 Feb;12(2): 143-50 Electronically Signed   By: Marcey Moan M.D.   On: 01/01/2022 09:33    Assessment & Plan:  Mass of left side of neck- Will evaluate for malignancy. -     CT SOFT TISSUE NECK W CONTRAST; Future  Acute cervical lymphadenitis -     CBC with Differential/Platelet; Future -     C-reactive protein; Future -     CT SOFT TISSUE NECK W CONTRAST; Future  Type 2 diabetes mellitus without complication, without long-term current use of insulin  (HCC) -     Basic metabolic panel with GFR; Future -     Hemoglobin A1c; Future  Primary hypertension - BP is well controlled. -     Basic metabolic panel with GFR; Future -     CBC with Differential/Platelet; Future     Follow-up: Return in about 3 months (around 09/08/2024).  Debby Molt, MD

## 2024-06-10 ENCOUNTER — Ambulatory Visit
Admission: RE | Admit: 2024-06-10 | Discharge: 2024-06-10 | Disposition: A | Discharge status: Home / Self Care | Source: Ambulatory Visit | Attending: Internal Medicine | Admitting: Internal Medicine

## 2024-06-10 DIAGNOSIS — R221 Localized swelling, mass and lump, neck: Secondary | ICD-10-CM

## 2024-06-10 DIAGNOSIS — K111 Hypertrophy of salivary gland: Secondary | ICD-10-CM | POA: Diagnosis not present

## 2024-06-10 DIAGNOSIS — L040 Acute lymphadenitis of face, head and neck: Secondary | ICD-10-CM

## 2024-06-10 DIAGNOSIS — R590 Localized enlarged lymph nodes: Secondary | ICD-10-CM | POA: Diagnosis not present

## 2024-06-10 MED ORDER — IOPAMIDOL (ISOVUE-300) INJECTION 61%
75.0000 mL | Freq: Once | INTRAVENOUS | Status: AC | PRN
  Administered 2024-06-10: 75 mL via INTRAVENOUS

## 2024-06-12 ENCOUNTER — Other Ambulatory Visit: Payer: Self-pay | Admitting: Internal Medicine

## 2024-06-12 DIAGNOSIS — I25118 Atherosclerotic heart disease of native coronary artery with other forms of angina pectoris: Secondary | ICD-10-CM

## 2024-06-12 DIAGNOSIS — K1120 Sialoadenitis, unspecified: Secondary | ICD-10-CM | POA: Insufficient documentation

## 2024-06-12 DIAGNOSIS — E119 Type 2 diabetes mellitus without complications: Secondary | ICD-10-CM

## 2024-06-12 MED ORDER — AMOXICILLIN-POT CLAVULANATE 875-125 MG PO TABS: 1.0000 | ORAL_TABLET | Freq: Two times a day (BID) | ORAL | 0 refills | Status: AC

## 2024-06-18 ENCOUNTER — Other Ambulatory Visit (HOSPITAL_COMMUNITY): Payer: Self-pay

## 2024-06-18 ENCOUNTER — Telehealth: Payer: Self-pay

## 2024-06-18 NOTE — Telephone Encounter (Unsigned)
 Copied from CRM [id number]. Topic: Clinical - Medication Question >> Jun 18, 2024  2:37 PM Shane Garrett wrote: Reason for CRM: [employer] have received prior auth  OZEMPIC , 2 MG/DOSE, 8 MG/3ML SOPN Quantity cannot 9 for 30 day   [id number] option 2

## 2024-06-18 NOTE — Telephone Encounter (Signed)
 Pharmacy Patient Advocate Encounter   Received notification from CoverMyMeds that prior authorization for Ozempic  8 is required/requested.   Insurance verification completed.   The patient is insured through [facility] ADVANTAGE/RX ADVANCE .   Per test claim: PA required; PA submitted to above mentioned insurance via CoverMyMeds Key/confirmation #/EOC [facility] Status is pending

## 2024-06-19 ENCOUNTER — Other Ambulatory Visit (HOSPITAL_COMMUNITY): Payer: Self-pay

## 2024-06-19 NOTE — Telephone Encounter (Signed)
 This has been approved for 1 year as of 06/18/2024. The quantity has been updated and the insurance company has given me a verbal understanding.

## 2024-06-19 NOTE — Telephone Encounter (Signed)
 Pharmacy Patient Advocate Encounter  Received notification from [employer] that Prior Authorization for Ozempic  8 has been APPROVED from 06/18/24 to 06/18/25. Ran test claim, Copay is $0.00. This test claim was processed through [facility]- copay amounts may vary at other pharmacies due to pharmacy/plan contracts, or as the patient moves through the different stages of their insurance plan.   PA #/Case ID/Reference #: [id number]

## 2024-06-23 ENCOUNTER — Ambulatory Visit: Admitting: Internal Medicine

## 2024-06-25 ENCOUNTER — Other Ambulatory Visit: Payer: Self-pay | Admitting: Internal Medicine

## 2024-06-25 DIAGNOSIS — E785 Hyperlipidemia, unspecified: Secondary | ICD-10-CM

## 2024-06-25 DIAGNOSIS — I25118 Atherosclerotic heart disease of native coronary artery with other forms of angina pectoris: Secondary | ICD-10-CM

## 2024-06-29 NOTE — Telephone Encounter (Signed)
 Last OV 06/08/24 Next OV 09/17/24  Last refill 02/24/24 Qty #90/0

## 2024-07-20 ENCOUNTER — Telehealth: Payer: Self-pay

## 2024-07-20 NOTE — Telephone Encounter (Signed)
 Copied from CRM [id number]. Topic: Clinical - Medication Question >> Jul 20, 2024  9:02 AM Jasmin G wrote: Reason for CRM: Pt states that he came in at the clinic for a swollen lymph node and was given an antibiotic that has not been working for him, he states that he threw it away so he could not confirm the name of it and I could not find it the notes, please call him back at [phone] to discuss.

## 2024-07-22 NOTE — Telephone Encounter (Signed)
 Unable to reach patient. LMTRC

## 2024-07-22 NOTE — Telephone Encounter (Signed)
 Patient has been scheduled to see Corean

## 2024-07-23 ENCOUNTER — Encounter: Payer: Self-pay | Admitting: Family Medicine

## 2024-07-23 ENCOUNTER — Ambulatory Visit (INDEPENDENT_AMBULATORY_CARE_PROVIDER_SITE_OTHER): Admitting: Family Medicine

## 2024-07-23 VITALS — BP 124/88 | HR 59 | Temp 98.2°F | Ht 73.0 in | Wt 213.8 lb

## 2024-07-23 DIAGNOSIS — L040 Acute lymphadenitis of face, head and neck: Secondary | ICD-10-CM | POA: Insufficient documentation

## 2024-07-23 DIAGNOSIS — R590 Localized enlarged lymph nodes: Secondary | ICD-10-CM | POA: Diagnosis not present

## 2024-07-23 DIAGNOSIS — K115 Sialolithiasis: Secondary | ICD-10-CM | POA: Insufficient documentation

## 2024-07-23 NOTE — Progress Notes (Signed)
 Acute Office Visit  Subjective:     Patient ID: Shane Garrett, male    DOB: 06-Jan-1956, 68 y.o.   MRN: [id number]  Chief Complaint  Patient presents with   Mass    Left side swollen lymph node, present for a few months. Was seen in July, and given an antibiotic. Denies fevers, ear pain, jaw pain. States he feels it worsens after exercise    HPI  Discussed the use of AI scribe software for clinical note transcription with the patient, who gave verbal consent to proceed.  History of Present Illness Juanantonio Stolar is a 68 year old male who presents with a swollen lymph node and a possible salivary gland stone.  Left Cervical lymphadenopathy - Swollen lymph node present for an extended period - Swelling fluctuates in size and becomes more pronounced on certain days, possibly related to physical activity - No current pain associated with the lymph node - No difficulty swallowing or eating - No fever or other systemic symptoms - No significant impact on daily life  Oral mucosal lesion - Small 'bubble' present under the tongue - No unusual taste in the mouth      ROS Per HPI      Objective:    BP 124/88 (BP Location: Left Arm, Patient Position: Sitting)   Pulse (!) 59   Temp 98.2 F (36.8 C) (Temporal)   Ht 6' 1 (1.854 m)   Wt 213 lb 12.8 oz (97 kg)   SpO2 97%   BMI 28.21 kg/m    Physical Exam Vitals and nursing note reviewed.  Constitutional:      General: He is not in acute distress.    Appearance: Normal appearance.  HENT:     Head: Normocephalic and atraumatic.     Right Ear: External ear normal.     Left Ear: External ear normal.     Nose: Nose normal.     Mouth/Throat:     Mouth: Mucous membranes are moist.     Pharynx: Oropharynx is clear.  Eyes:     Extraocular Movements: Extraocular movements intact.  Cardiovascular:     Rate and Rhythm: Normal rate and regular rhythm.     Pulses: Normal pulses.     Heart sounds: Normal heart  sounds.  Pulmonary:     Effort: Pulmonary effort is normal. No respiratory distress.     Breath sounds: Normal breath sounds. No wheezing, rhonchi or rales.  Musculoskeletal:        General: Normal range of motion.     Cervical back: Normal range of motion.     Right lower leg: No edema.     Left lower leg: No edema.  Lymphadenopathy:     Cervical: No cervical adenopathy.  Skin:    General: Skin is warm and dry.  Neurological:     General: No focal deficit present.     Mental Status: He is alert and oriented to person, place, and time.  Psychiatric:        Mood and Affect: Mood normal.        Behavior: Behavior normal.     No results found for any visits on 07/23/24.      Assessment & Plan:   Assessment and Plan Assessment & Plan Acute lymphadenitis of face, head and neck Intermittent swelling of right facial lymph nodes, likely reactive to salivary gland stone. Non-tender, no fever, CT scan normal. - Monitor lymph node size, pain, tenderness. - Return if  symptoms worsen or fever develops. - Consider ENT referral if symptoms persist or worsen.  Salivary gland stone of tongue Small stone under right tongue, possibly contributing to lymphadenitis. No significant discomfort or impairment. - Consume sour foods or candies to stimulate salivary flow. - Use gentle massage techniques to reduce swelling. - Seek evaluation if stone causes significant discomfort or functional issues.     No orders of the defined types were placed in this encounter.    No orders of the defined types were placed in this encounter.   Return if symptoms worsen or fail to improve.  Corean LITTIE Ku, FNP

## 2024-07-23 NOTE — Patient Instructions (Signed)
 Recommend trying sour candy, lemon to help stimulate saliva production to help move saliva through stones.   Usually reactive lymph nodes resolve on their own.   We can consider ENT if referral symptoms persist.

## 2024-08-02 ENCOUNTER — Other Ambulatory Visit: Payer: Self-pay | Admitting: Internal Medicine

## 2024-08-02 DIAGNOSIS — I25118 Atherosclerotic heart disease of native coronary artery with other forms of angina pectoris: Secondary | ICD-10-CM

## 2024-08-19 ENCOUNTER — Other Ambulatory Visit: Payer: Self-pay | Admitting: Internal Medicine

## 2024-08-19 DIAGNOSIS — I10 Essential (primary) hypertension: Secondary | ICD-10-CM

## 2024-08-28 DIAGNOSIS — L570 Actinic keratosis: Secondary | ICD-10-CM | POA: Diagnosis not present

## 2024-08-28 DIAGNOSIS — D225 Melanocytic nevi of trunk: Secondary | ICD-10-CM | POA: Diagnosis not present

## 2024-08-28 DIAGNOSIS — X32XXXD Exposure to sunlight, subsequent encounter: Secondary | ICD-10-CM | POA: Diagnosis not present

## 2024-08-28 DIAGNOSIS — L821 Other seborrheic keratosis: Secondary | ICD-10-CM | POA: Diagnosis not present

## 2024-08-30 ENCOUNTER — Other Ambulatory Visit: Payer: Self-pay | Admitting: Internal Medicine

## 2024-08-30 DIAGNOSIS — M1A0790 Idiopathic chronic gout, unspecified ankle and foot, without tophus (tophi): Secondary | ICD-10-CM

## 2024-08-30 DIAGNOSIS — F325 Major depressive disorder, single episode, in full remission: Secondary | ICD-10-CM

## 2024-08-30 DIAGNOSIS — F411 Generalized anxiety disorder: Secondary | ICD-10-CM

## 2024-09-04 ENCOUNTER — Other Ambulatory Visit: Payer: Self-pay | Admitting: Internal Medicine

## 2024-09-04 DIAGNOSIS — I10 Essential (primary) hypertension: Secondary | ICD-10-CM

## 2024-09-05 ENCOUNTER — Other Ambulatory Visit: Payer: Self-pay | Admitting: Internal Medicine

## 2024-09-05 DIAGNOSIS — E119 Type 2 diabetes mellitus without complications: Secondary | ICD-10-CM

## 2024-09-05 DIAGNOSIS — I25118 Atherosclerotic heart disease of native coronary artery with other forms of angina pectoris: Secondary | ICD-10-CM

## 2024-09-07 ENCOUNTER — Telehealth: Payer: Self-pay

## 2024-09-07 NOTE — Telephone Encounter (Signed)
 Copied from CRM [id number]. Topic: Clinical - Prescription Issue >> Sep 07, 2024  9:02 AM Darshell M wrote: Reason for CRM: Patient requesting prescription for cialis. CB# [phone]  [id number] - [place], [place] - [address] [place] [address] Phone: [phone] Fax: [phone]

## 2024-09-08 ENCOUNTER — Other Ambulatory Visit: Payer: Self-pay | Admitting: Internal Medicine

## 2024-09-08 DIAGNOSIS — E119 Type 2 diabetes mellitus without complications: Secondary | ICD-10-CM

## 2024-09-09 ENCOUNTER — Other Ambulatory Visit: Payer: Self-pay | Admitting: Internal Medicine

## 2024-09-10 ENCOUNTER — Other Ambulatory Visit (HOSPITAL_COMMUNITY): Payer: Self-pay

## 2024-09-10 ENCOUNTER — Telehealth: Payer: Self-pay

## 2024-09-10 ENCOUNTER — Other Ambulatory Visit: Payer: Self-pay | Admitting: Internal Medicine

## 2024-09-10 DIAGNOSIS — N5201 Erectile dysfunction due to arterial insufficiency: Secondary | ICD-10-CM | POA: Insufficient documentation

## 2024-09-10 MED ORDER — TADALAFIL 5 MG PO TABS: 5.0000 mg | ORAL_TABLET | Freq: Every day | ORAL | 0 refills | Status: AC

## 2024-09-10 NOTE — Telephone Encounter (Signed)
 Pharmacy Patient Advocate Encounter   Received notification from CoverMyMeds that prior authorization for Tadalafil 5MG  tablets  is required/requested.   Insurance verification completed.   The patient is insured through [facility] ADVANTAGE/RX ADVANCE.  Per test claim: Per test claim, medication is not covered due to plan/benefit exclusion, PA not submitted at this time  PER PLAN: If treating ED, Part D exclusion.  If treating BPH/PAH -  PA required.  IF BEING TREATED FOR BPH/PAH WE MAY PROCEED WITH PA PLEASE BE ADVISED.

## 2024-09-11 ENCOUNTER — Telehealth: Payer: Self-pay

## 2024-09-11 NOTE — Telephone Encounter (Signed)
 Copied from CRM [id number]. Topic: Clinical - Prescription Issue >> Sep 07, 2024  9:02 AM Darshell M wrote: Reason for CRM: Patient requesting prescription for cialis. CB# [phone]  [facility] - [place], [place] - [address] [place] [address] Phone: [phone] Fax: [phone] >> Sep 10, 2024  1:03 PM Drema MATSU wrote: Patient is requesting a update on request. Her wants to know if pcp is going to fill it or not.

## 2024-09-11 NOTE — Telephone Encounter (Signed)
Medication has been refill

## 2024-09-17 ENCOUNTER — Ambulatory Visit

## 2024-09-25 ENCOUNTER — Ambulatory Visit (INDEPENDENT_AMBULATORY_CARE_PROVIDER_SITE_OTHER)

## 2024-09-25 VITALS — Ht 72.0 in | Wt 213.0 lb

## 2024-09-25 DIAGNOSIS — Z0000 Encounter for general adult medical examination without abnormal findings: Secondary | ICD-10-CM

## 2024-09-25 NOTE — Patient Instructions (Addendum)
 [name],  Thank you for taking the time for your Medicare Wellness Visit. I appreciate your continued commitment to your health goals. Please review the care plan we discussed, and feel free to reach out if I can assist you further.  Medicare recommends these wellness visits once per year to help you and your care team stay ahead of potential health issues. These visits are designed to focus on prevention, allowing your provider to concentrate on managing your acute and chronic conditions during your regular appointments.  Please note that Annual Wellness Visits do not include a physical exam. Some assessments may be limited, especially if the visit was conducted virtually. If needed, we may recommend a separate in-person follow-up with your provider.  Ongoing Care Seeing your primary care provider every 3 to 6 months helps us  monitor your health and provide consistent, personalized care.   Referrals If a referral was made during today's visit and you haven't received any updates within two weeks, please contact the referred provider directly to check on the status.  Recommended Screenings:  Health Maintenance  Topic Date Due   Flu Shot  Never done   Eye exam for diabetics  11/10/2024   Hemoglobin A1C  12/09/2024   Yearly kidney health urinalysis for diabetes  03/12/2025   Complete foot exam   03/12/2025   Yearly kidney function blood test for diabetes  06/08/2025   Medicare Annual Wellness Visit  09/25/2025   Colon Cancer Screening  03/09/2027   DTaP/Tdap/Td vaccine (2 - Td or Tdap) 07/13/2031   Pneumococcal Vaccine for age over 26  Completed   Hepatitis C Screening  Completed   Zoster (Shingles) Vaccine  Completed   Meningitis B Vaccine  Aged Out   COVID-19 Vaccine  Discontinued       09/25/2024    8:59 AM  Advanced Directives  Does Patient Have a Medical Advance Directive? Yes  Type of Estate Agent of [place];Living will  Copy of Healthcare Power of  Attorney in Chart? No - copy requested   Advance Care Planning is important because it: Ensures you receive medical care that aligns with your values, goals, and preferences. Provides guidance to your family and loved ones, reducing the emotional burden of decision-making during critical moments.  Vision: Annual vision screenings are recommended for early detection of glaucoma, cataracts, and diabetic retinopathy. These exams can also reveal signs of chronic conditions such as diabetes and high blood pressure.  Dental: Annual dental screenings help detect early signs of oral cancer, gum disease, and other conditions linked to overall health, including heart disease and diabetes.

## 2024-09-25 NOTE — Progress Notes (Signed)
 Subjective:   Shane Garrett is a 68 y.o. who presents for a Medicare Wellness preventive visit.  As a reminder, Annual Wellness Visits don't include a physical exam, and some assessments may be limited, especially if this visit is performed virtually. We may recommend an in-person follow-up visit with your provider if needed.  Visit Complete: Virtual I connected with  Shane Garrett on 09/25/24 by a audio enabled telemedicine application and verified that I am speaking with the correct person using two identifiers.  Patient Location: Home  Provider Location: Office/Clinic  I discussed the limitations of evaluation and management by telemedicine. The patient expressed understanding and agreed to proceed.  Vital Signs: Because this visit was a virtual/telehealth visit, some criteria may be missing or patient reported. Any vitals not documented were not able to be obtained and vitals that have been documented are patient reported.  VideoDeclined- This patient declined Librarian, academic. Therefore the visit was completed with audio only.  Persons Participating in Visit: Patient.  AWV Questionnaire: No: Patient Medicare AWV questionnaire was not completed prior to this visit.  Cardiac Risk Factors include: advanced age (>58men, >82 women);diabetes mellitus;dyslipidemia;hypertension;male gender     Objective:    Today's Vitals   09/25/24 0857  Weight: 213 lb (96.6 kg)  Height: 6' (1.829 m)   Body mass index is 28.89 kg/m.     09/25/2024    8:59 AM 09/12/2023    3:23 PM 06/08/2022    9:00 AM  Advanced Directives  Does Patient Have a Medical Advance Directive? Yes Yes Yes  Type of Estate Agent of [place];Living will Healthcare Power of [place];Living will Living will  Copy of Healthcare Power of Attorney in Chart? No - copy requested No - copy requested     Current Medications (verified) Outpatient Encounter Medications as  of 09/25/2024  Medication Sig   allopurinol  (ZYLOPRIM ) 100 MG tablet Take 1 tablet by mouth once daily   aspirin  EC 81 MG tablet Take 1 tablet (81 mg total) by mouth daily.   citalopram  (CELEXA ) 40 MG tablet Take 1 tablet by mouth once daily   clonazePAM  (KLONOPIN ) 0.5 MG tablet TAKE 1 TABLET BY MOUTH ONCE DAILY AS NEEDED FOR ANXIETY   colchicine 0.6 MG tablet Take by mouth as needed.    ezetimibe  (ZETIA ) 10 MG tablet Take 1 tablet by mouth once daily   Insulin  Pen Needle 32G X 6 MM MISC 1 Act by Does not apply route once a week.   isosorbide  mononitrate (IMDUR ) 30 MG 24 hr tablet Take 1 tablet (30 mg total) by mouth daily.   metFORMIN  (GLUCOPHAGE -XR) 750 MG 24 hr tablet Take 1 tablet by mouth once daily with breakfast   metoprolol  succinate (TOPROL -XL) 50 MG 24 hr tablet TAKE 1 TABLET BY MOUTH ONCE DAILY WITH A MEAL   nitroGLYCERIN  (NITROSTAT ) 0.4 MG SL tablet Place 1 tablet (0.4 mg total) under the tongue every 5 (five) minutes as needed for chest pain.   olmesartan  (BENICAR ) 40 MG tablet Take 1 tablet by mouth once daily   OZEMPIC , 2 MG/DOSE, 8 MG/3ML SOPN INJECT 2MG  SUBCUTANEOUSLY ONCE A WEEK   rosuvastatin  (CRESTOR ) 40 MG tablet Take 1 tablet by mouth once daily   tadalafil (CIALIS) 5 MG tablet Take 1 tablet (5 mg total) by mouth daily.   No facility-administered encounter medications on file as of 09/25/2024.    Allergies (verified) Patient has no known allergies.   History: Past Medical History:  Diagnosis Date   Anxiety    CAD (coronary artery disease)    2V CABG in 2012   Diabetes mellitus without complication (HCC)    Hyperlipidemia    Hypertension    Past Surgical History:  Procedure Laterality Date   CORONARY ARTERY BYPASS GRAFT     2V CABG in [place]    Family History  Problem Relation Age of Onset   Cancer Mother    Heart attack Father 45   Diabetes Brother    Social History   Socioeconomic History   Marital status: Divorced    Spouse name: Not on file    Number of children: 2   Years of education: Not on file   Highest education level: Not on file  Occupational History   Occupation: Retired-Sold building supplies  Tobacco Use   Smoking status: Never   Smokeless tobacco: Never  Vaping Use   Vaping status: Never Used  Substance and Sexual Activity   Alcohol use: Never   Drug use: Never   Sexual activity: Yes  Other Topics Concern   Not on file  Social History Narrative   Lives alone.   Social Drivers of Corporate Investment Banker Strain: Low Risk  (09/25/2024)   Overall Financial Resource Strain (CARDIA)    Difficulty of Paying Living Expenses: Not hard at all  Food Insecurity: No Food Insecurity (09/25/2024)   Hunger Vital Sign    Worried About Running Out of Food in the Last Year: Never true    Ran Out of Food in the Last Year: Never true  Transportation Needs: No Transportation Needs (09/25/2024)   PRAPARE - Administrator, Civil Service (Medical): No    Lack of Transportation (Non-Medical): No  Physical Activity: Sufficiently Active (09/25/2024)   Exercise Vital Sign    Days of Exercise per Week: 5 days    Minutes of Exercise per Session: 60 min  Stress: No Stress Concern Present (09/25/2024)   [employer] of Occupational Health - Occupational Stress Questionnaire    Feeling of Stress: Not at all  Social Connections: Moderately Integrated (09/25/2024)   Social Connection and Isolation Panel    Frequency of Communication with Friends and Family: More than three times a week    Frequency of Social Gatherings with Friends and Family: More than three times a week    Attends Religious Services: More than 4 times per year    Active Member of [employer] or Organizations: Yes    Attends Banker Meetings: 1 to 4 times per year    Marital Status: Divorced    Tobacco Counseling Counseling given: Not Answered    Clinical Intake:  Pre-visit preparation completed: Yes  Pain : No/denies pain      BMI - recorded: 28.89 Nutritional Risks: None Diabetes: Yes CBG done?: No Did pt. bring in CBG monitor from home?: No  Lab Results  Component Value Date   HGBA1C 6.5 06/08/2024   HGBA1C 6.4 03/12/2024   HGBA1C 6.3 09/18/2023     How often do you need to have someone help you when you read instructions, pamphlets, or other written materials from your doctor or pharmacy?: 1 - Never  Interpreter Needed?: No  Information entered by :: Verdie Saba, CMA   Activities of Daily Living     09/25/2024    9:02 AM  In your present state of health, do you have any difficulty performing the following activities:  Hearing? 0  Vision? 0  Difficulty concentrating  or making decisions? 0  Walking or climbing stairs? 0  Dressing or bathing? 0  Doing errands, shopping? 0  Preparing Food and eating ? N  Using the Toilet? N  In the past six months, have you accidently leaked urine? N  Do you have problems with loss of bowel control? N  Managing your Medications? N  Managing your Finances? N  Housekeeping or managing your Housekeeping? N    Patient Care Team: Joshua Debby CROME, MD as PCP - General (Internal Medicine) Verlin Lonni BIRCH, MD as PCP - Cardiology (Cardiology) Waylan Cain, MD as Consulting Physician (Ophthalmology)  I have updated your Care Teams any recent Medical Services you may have received from other providers in the past year.     Assessment:   This is a routine wellness examination for Hymen.  Hearing/Vision screen Hearing Screening - Comments:: Denies hearing difficulties   Vision Screening - Comments:: Wears eyeglasses for reading - up to date with routine eye exams with Cain Waylan   Goals Addressed               This Visit's Progress     Patient Stated (pt-stated)        Patient stated he plans to continue playing pickle ball and golf weekly       Depression Screen     09/25/2024    9:15 AM 07/23/2024    2:04 PM 06/08/2024    8:20  AM 03/12/2024    8:09 AM 09/18/2023    8:28 AM 09/12/2023    3:28 PM 01/24/2023    8:51 AM  PHQ 2/9 Scores  PHQ - 2 Score 0 0 0 0 1 0 0  PHQ- 9 Score 0 2  2 4 4      Fall Risk     09/25/2024    9:03 AM 06/08/2024    8:20 AM 03/12/2024    8:09 AM 09/18/2023    8:28 AM 09/12/2023    3:24 PM  Fall Risk   Falls in the past year? 0 0 0 0 0  Number falls in past yr: 0 0 0 0 0  Injury with Fall? 0 0 0 0 0  Risk for fall due to : No Fall Risks No Fall Risks No Fall Risks No Fall Risks No Fall Risks  Follow up Falls evaluation completed;Falls prevention discussed Falls evaluation completed Falls evaluation completed Falls evaluation completed Falls prevention discussed;Falls evaluation completed    MEDICARE RISK AT HOME:  Medicare Risk at Home Any stairs in or around the home?: No If so, are there any without handrails?: No Home free of loose throw rugs in walkways, pet beds, electrical cords, etc?: Yes Adequate lighting in your home to reduce risk of falls?: Yes Life alert?: No Use of a cane, walker or w/c?: No Grab bars in the bathroom?: No Shower chair or bench in shower?: Yes Elevated toilet seat or a handicapped toilet?: No  TIMED UP AND GO:  Was the test performed?  No  Cognitive Function: 6CIT completed        09/25/2024    9:06 AM 09/12/2023    3:25 PM 06/08/2022    9:06 AM  6CIT Screen  What Year? 0 points 0 points 0 points  What month? 0 points 0 points 0 points  What time? 0 points 0 points 0 points  Count back from 20 0 points 0 points 0 points  Months in reverse 2 points 2 points 0 points  Repeat phrase  2 points 0 points 0 points  Total Score 4 points 2 points 0 points    Immunizations Immunization History  Administered Date(s) Administered   PFIZER(Purple Top)SARS-COV-2 Vaccination 05/07/2020, 06/04/2020   Pneumococcal Conjugate-13 04/14/2019   Pneumococcal Polysaccharide-23 07/19/2022   Tdap 07/12/2021   Zoster Recombinant(Shingrix) 12/01/2018,  10/09/2019   Zoster, Live 10/01/2013    Screening Tests Health Maintenance  Topic Date Due   Influenza Vaccine  Never done   OPHTHALMOLOGY EXAM  11/10/2024   HEMOGLOBIN A1C  12/09/2024   Diabetic kidney evaluation - Urine ACR  03/12/2025   FOOT EXAM  03/12/2025   Diabetic kidney evaluation - eGFR measurement  06/08/2025   Medicare Annual Wellness (AWV)  09/25/2025   Colonoscopy  03/09/2027   DTaP/Tdap/Td (2 - Td or Tdap) 07/13/2031   Pneumococcal Vaccine: 50+ Years  Completed   Hepatitis C Screening  Completed   Zoster Vaccines- Shingrix  Completed   Meningococcal B Vaccine  Aged Out   COVID-19 Vaccine  Discontinued    Health Maintenance Items Addressed:  I have recommended that this patient have a flu shot but he declines at this time. I have discussed the risks and benefits of this procedure with him. The patient verbalizes understanding.   Additional Screening:  Vision Screening: Recommended annual ophthalmology exams for early detection of glaucoma and other disorders of the eye. Is the patient up to date with their annual eye exam?  Yes  Who is the provider or what is the name of the office in which the patient attends annual eye exams? [facility]  Dental Screening: Recommended annual dental exams for proper oral hygiene  Community Resource Referral / Chronic Care Management: CRR required this visit?  No   CCM required this visit?  No   Plan:    I have personally reviewed and noted the following in the patient's chart:   Medical and social history Use of alcohol, tobacco or illicit drugs  Current medications and supplements including opioid prescriptions. Patient is not currently taking opioid prescriptions. Functional ability and status Nutritional status Physical activity Advanced directives List of other physicians Hospitalizations, surgeries, and ER visits in previous 12 months Vitals Screenings to include cognitive, depression, and falls Referrals  and appointments  In addition, I have reviewed and discussed with patient certain preventive protocols, quality metrics, and best practice recommendations. A written personalized care plan for preventive services as well as general preventive health recommendations were provided to patient.   Verdie CHRISTELLA Saba, CMA   09/25/2024   After Visit Summary: (MyChart) Due to this being a telephonic visit, the after visit summary with patients personalized plan was offered to patient via MyChart   Notes: Scheduled a 6-mth Diabetes f/u w/PCP for 11/2024.

## 2024-11-02 ENCOUNTER — Other Ambulatory Visit: Payer: Self-pay | Admitting: Internal Medicine

## 2024-11-02 DIAGNOSIS — I25118 Atherosclerotic heart disease of native coronary artery with other forms of angina pectoris: Secondary | ICD-10-CM

## 2024-11-13 ENCOUNTER — Other Ambulatory Visit: Payer: Self-pay | Admitting: Internal Medicine

## 2024-11-13 DIAGNOSIS — I25118 Atherosclerotic heart disease of native coronary artery with other forms of angina pectoris: Secondary | ICD-10-CM

## 2024-11-13 DIAGNOSIS — I10 Essential (primary) hypertension: Secondary | ICD-10-CM

## 2024-11-30 ENCOUNTER — Other Ambulatory Visit: Payer: Self-pay | Admitting: Internal Medicine

## 2024-11-30 DIAGNOSIS — I25118 Atherosclerotic heart disease of native coronary artery with other forms of angina pectoris: Secondary | ICD-10-CM

## 2024-11-30 DIAGNOSIS — E119 Type 2 diabetes mellitus without complications: Secondary | ICD-10-CM

## 2024-12-05 ENCOUNTER — Other Ambulatory Visit: Payer: Self-pay | Admitting: Internal Medicine

## 2024-12-05 DIAGNOSIS — F325 Major depressive disorder, single episode, in full remission: Secondary | ICD-10-CM

## 2024-12-05 DIAGNOSIS — M1A0790 Idiopathic chronic gout, unspecified ankle and foot, without tophus (tophi): Secondary | ICD-10-CM

## 2024-12-05 DIAGNOSIS — E119 Type 2 diabetes mellitus without complications: Secondary | ICD-10-CM

## 2024-12-14 LAB — OPHTHALMOLOGY REPORT-SCANNED

## 2024-12-16 ENCOUNTER — Ambulatory Visit: Admitting: Internal Medicine

## 2024-12-16 ENCOUNTER — Encounter: Payer: Self-pay | Admitting: Internal Medicine

## 2024-12-16 VITALS — BP 136/80 | HR 59 | Temp 98.2°F | Ht 72.0 in | Wt 231.0 lb

## 2024-12-16 DIAGNOSIS — M1712 Unilateral primary osteoarthritis, left knee: Secondary | ICD-10-CM

## 2024-12-16 DIAGNOSIS — R001 Bradycardia, unspecified: Secondary | ICD-10-CM | POA: Diagnosis not present

## 2024-12-16 DIAGNOSIS — E785 Hyperlipidemia, unspecified: Secondary | ICD-10-CM | POA: Diagnosis not present

## 2024-12-16 DIAGNOSIS — I25118 Atherosclerotic heart disease of native coronary artery with other forms of angina pectoris: Secondary | ICD-10-CM | POA: Diagnosis not present

## 2024-12-16 DIAGNOSIS — Z0001 Encounter for general adult medical examination with abnormal findings: Secondary | ICD-10-CM

## 2024-12-16 DIAGNOSIS — M1A0790 Idiopathic chronic gout, unspecified ankle and foot, without tophus (tophi): Secondary | ICD-10-CM | POA: Diagnosis not present

## 2024-12-16 DIAGNOSIS — R972 Elevated prostate specific antigen [PSA]: Secondary | ICD-10-CM

## 2024-12-16 DIAGNOSIS — I10 Essential (primary) hypertension: Secondary | ICD-10-CM | POA: Diagnosis not present

## 2024-12-16 DIAGNOSIS — E11618 Type 2 diabetes mellitus with other diabetic arthropathy: Secondary | ICD-10-CM

## 2024-12-16 DIAGNOSIS — Z0000 Encounter for general adult medical examination without abnormal findings: Secondary | ICD-10-CM | POA: Diagnosis not present

## 2024-12-16 LAB — URINALYSIS, ROUTINE W REFLEX MICROSCOPIC
Bilirubin Urine: NEGATIVE
Hgb urine dipstick: NEGATIVE
Ketones, ur: NEGATIVE
Leukocytes,Ua: NEGATIVE
Nitrite: NEGATIVE
RBC / HPF: NONE SEEN
Specific Gravity, Urine: <=1.005 — AB (ref 1.000–1.030)
Total Protein, Urine: NEGATIVE
Urine Glucose: NEGATIVE
Urobilinogen, UA: 0.2 (ref 0.0–1.0)
WBC, UA: NONE SEEN
pH: 7 (ref 5.0–8.0)

## 2024-12-16 LAB — LIPID PANEL
Cholesterol: 105 mg/dL (ref 28–200)
HDL: 40 mg/dL
LDL Cholesterol: 50 mg/dL (ref 10–99)
NonHDL: 65.07
Total CHOL/HDL Ratio: 3
Triglycerides: 73 mg/dL (ref 10.0–149.0)
VLDL: 14.6 mg/dL (ref 0.0–40.0)

## 2024-12-16 LAB — CBC WITH DIFFERENTIAL/PLATELET
Basophils Absolute: 0.1 K/uL (ref 0.0–0.1)
Basophils Relative: 1.2 % (ref 0.0–3.0)
Eosinophils Absolute: 0.7 K/uL (ref 0.0–0.7)
Eosinophils Relative: 9 % — ABNORMAL HIGH (ref 0.0–5.0)
HCT: 40.1 % (ref 39.0–52.0)
Hemoglobin: 13.6 g/dL (ref 13.0–17.0)
Lymphocytes Relative: 24 % (ref 12.0–46.0)
Lymphs Abs: 1.8 K/uL (ref 0.7–4.0)
MCHC: 33.9 g/dL (ref 30.0–36.0)
MCV: 89.6 fl (ref 78.0–100.0)
Monocytes Absolute: 0.7 K/uL (ref 0.1–1.0)
Monocytes Relative: 9.1 % (ref 3.0–12.0)
Neutro Abs: 4.2 K/uL (ref 1.4–7.7)
Neutrophils Relative %: 56.7 % (ref 43.0–77.0)
Platelets: 157 K/uL (ref 150.0–400.0)
RBC: 4.47 Mil/uL (ref 4.22–5.81)
RDW: 14.1 % (ref 11.5–15.5)
WBC: 7.3 K/uL (ref 4.0–10.5)

## 2024-12-16 LAB — HEPATIC FUNCTION PANEL
ALT: 19 U/L (ref 3–53)
AST: 21 U/L (ref 5–37)
Albumin: 3.9 g/dL (ref 3.5–5.2)
Alkaline Phosphatase: 44 U/L (ref 39–117)
Bilirubin, Direct: 0.1 mg/dL (ref 0.1–0.3)
Total Bilirubin: 0.5 mg/dL (ref 0.2–1.2)
Total Protein: 6.5 g/dL (ref 6.0–8.3)

## 2024-12-16 LAB — BASIC METABOLIC PANEL WITH GFR
BUN: 11 mg/dL (ref 6–23)
CO2: 32 meq/L (ref 19–32)
Calcium: 9.8 mg/dL (ref 8.4–10.5)
Chloride: 106 meq/L (ref 96–112)
Creatinine, Ser: 1.19 mg/dL (ref 0.40–1.50)
GFR: 61.75 mL/min
Glucose, Bld: 80 mg/dL (ref 70–99)
Potassium: 4.5 meq/L (ref 3.5–5.1)
Sodium: 140 meq/L (ref 135–145)

## 2024-12-16 LAB — HEMOGLOBIN A1C: Hgb A1c MFr Bld: 6.5 % (ref 4.6–6.5)

## 2024-12-16 LAB — URIC ACID: Uric Acid, Serum: 5.2 mg/dL (ref 4.0–7.8)

## 2024-12-16 LAB — MICROALBUMIN / CREATININE URINE RATIO
Creatinine,U: 28.8 mg/dL
Microalb Creat Ratio: UNDETERMINED mg/g (ref 0.0–30.0)
Microalb, Ur: <0.7 mg/dL

## 2024-12-16 LAB — PSA: PSA: 4.92 ng/mL — ABNORMAL HIGH (ref 0.10–4.00)

## 2024-12-16 LAB — TSH: TSH: 2.8 u[IU]/mL (ref 0.35–5.50)

## 2024-12-16 LAB — VITAMIN B12: Vitamin B-12: 266 pg/mL (ref 211–911)

## 2024-12-16 MED ORDER — WEGOVY 2.4 MG/0.75ML ~~LOC~~ SOAJ: 2.4000 mg | SUBCUTANEOUS | 0 refills | Status: AC

## 2024-12-16 NOTE — Progress Notes (Unsigned)
 "  Subjective:  Patient ID: Shane Garrett, male    DOB: 03-02-1956  Age: 69 y.o. MRN: [id number]  CC: Annual Exam, Diabetes, Hypertension, Osteoarthritis, and Hyperlipidemia   HPI Shane Garrett presents for a CPX and f/up ---   Discussed the use of AI scribe software for clinical note transcription with the patient, who gave verbal consent to proceed.  History of Present Illness Shane Garrett is a 69 year old male who presents with weight gain and low heart rate.  He has experienced a weight gain of approximately 20 pounds since October, with his weight increasing from 213 pounds on Halloween to 231 pounds currently. He attributes this weight gain to missing a dose of Ozempic  for a week and notes that he has been gaining weight since resuming the medication. He is on the maximum dose of Ozempic  and denies any side effects such as abdominal pain, nausea, or vomiting. He mentions that he had more problems when the medication was working effectively, but now feels 'back to normal.'  He denies any symptoms such as dizziness or lightheadedness. He remains active, playing pickleball recently, and reports good endurance during physical activity. Cold weather does not affect him, and he denies any respiratory symptoms such as coughing or wheezing.  He has a history of elevated PSA but denies any current urinary symptoms. He recalls having a PSA test in the past few months, with the last recorded result being 3.5 in June.     Outpatient Medications Prior to Visit  Medication Sig Dispense Refill   allopurinol  (ZYLOPRIM ) 100 MG tablet Take 1 tablet by mouth once daily 90 tablet 0   citalopram  (CELEXA ) 40 MG tablet Take 1 tablet by mouth once daily 90 tablet 0   clonazePAM  (KLONOPIN ) 0.5 MG tablet TAKE 1 TABLET BY MOUTH ONCE DAILY AS NEEDED FOR ANXIETY 90 tablet 0   colchicine 0.6 MG tablet Take by mouth as needed.      ezetimibe  (ZETIA ) 10 MG tablet Take 1 tablet by mouth once daily 90  tablet 0   Insulin  Pen Needle 32G X 6 MM MISC 1 Act by Does not apply route once a week. 30 each 1   isosorbide  mononitrate (IMDUR ) 30 MG 24 hr tablet Take 1 tablet by mouth once daily 90 tablet 0   metFORMIN  (GLUCOPHAGE -XR) 750 MG 24 hr tablet Take 1 tablet by mouth once daily with breakfast 90 tablet 0   metoprolol  succinate (TOPROL -XL) 50 MG 24 hr tablet TAKE 1 TABLET BY MOUTH ONCE DAILY WITH A MEAL 90 tablet 0   nitroGLYCERIN  (NITROSTAT ) 0.4 MG SL tablet Place 1 tablet (0.4 mg total) under the tongue every 5 (five) minutes as needed for chest pain. 25 tablet 3   olmesartan  (BENICAR ) 40 MG tablet Take 1 tablet by mouth once daily 90 tablet 0   OZEMPIC , 2 MG/DOSE, 8 MG/3ML SOPN INJECT 2 MG SUBCUTANEOUSLY ONCE A WEEK. 9 mL 0   tadalafil  (CIALIS ) 5 MG tablet Take 1 tablet (5 mg total) by mouth daily. 90 tablet 0   aspirin  EC 81 MG tablet Take 1 tablet (81 mg total) by mouth daily. 90 tablet 1   rosuvastatin  (CRESTOR ) 40 MG tablet Take 1 tablet by mouth once daily 90 tablet 1   No facility-administered medications prior to visit.    ROS Review of Systems  Constitutional:  Positive for unexpected weight change (wt gain). Negative for appetite change, chills, diaphoresis, fatigue and fever.  HENT: Negative.  Eyes: Negative.   Respiratory: Negative.  Negative for apnea, choking, shortness of breath and wheezing.   Cardiovascular:  Negative for chest pain, palpitations and leg swelling.  Gastrointestinal: Negative.  Negative for abdominal pain, constipation, diarrhea, nausea and vomiting.  Genitourinary: Negative.  Negative for difficulty urinating and dysuria.  Musculoskeletal:  Positive for arthralgias. Negative for myalgias.  Neurological:  Negative for dizziness.  Hematological:  Negative for adenopathy. Does not bruise/bleed easily.  Psychiatric/Behavioral:  Negative for decreased concentration and dysphoric mood. The patient is nervous/anxious.     Objective:  BP 136/80 (BP  Location: Left Arm, Patient Position: Sitting, Cuff Size: Normal)   Pulse (!) 59   Temp 98.2 F (36.8 C) (Oral)   Ht 6' (1.829 m)   Wt 231 lb (104.8 kg)   SpO2 99%   BMI 31.33 kg/m   BP Readings from Last 3 Encounters:  12/16/24 136/80  07/23/24 124/88  06/08/24 132/84    Wt Readings from Last 3 Encounters:  12/16/24 231 lb (104.8 kg)  09/25/24 213 lb (96.6 kg)  07/23/24 213 lb 12.8 oz (97 kg)    Physical Exam Vitals reviewed.  Constitutional:      Appearance: Normal appearance.  HENT:     Nose: Nose normal.     Mouth/Throat:     Mouth: Mucous membranes are moist.  Eyes:     General: No scleral icterus.    Conjunctiva/sclera: Conjunctivae normal.  Cardiovascular:     Rate and Rhythm: Regular rhythm. Bradycardia present.     Heart sounds: Normal heart sounds, S1 normal and S2 normal. No murmur heard.    No friction rub. No gallop.     Comments: EKG--- SB with 1st degree AV block, 54 bpm No LVH, Q waves, or ST/T wave changes  Unchanged  Pulmonary:     Effort: Pulmonary effort is normal.     Breath sounds: No stridor. No wheezing, rhonchi or rales.  Abdominal:     General: Abdomen is flat.     Palpations: There is no mass.     Tenderness: There is no abdominal tenderness. There is no guarding.     Hernia: No hernia is present.  Genitourinary:    Comments: DRE deferred at his request Musculoskeletal:     Cervical back: Neck supple.     Right lower leg: No edema.     Left lower leg: No edema.  Skin:    General: Skin is warm and dry.  Neurological:     General: No focal deficit present.     Mental Status: He is alert. Mental status is at baseline.  Psychiatric:        Mood and Affect: Mood normal.        Behavior: Behavior normal.     Lab Results  Component Value Date   WBC 7.3 12/16/2024   HGB 13.6 12/16/2024   HCT 40.1 12/16/2024   PLT 157.0 12/16/2024   GLUCOSE 80 12/16/2024   CHOL 105 12/16/2024   TRIG 73.0 12/16/2024   HDL 40.00 12/16/2024    LDLDIRECT 67.0 04/14/2020   LDLCALC 50 12/16/2024   ALT 19 12/16/2024   AST 21 12/16/2024   NA 140 12/16/2024   K 4.5 12/16/2024   CL 106 12/16/2024   CREATININE 1.19 12/16/2024   BUN 11 12/16/2024   CO2 32 12/16/2024   TSH 2.80 12/16/2024   PSA 4.92 (H) 12/16/2024   HGBA1C 6.5 12/16/2024   MICROALBUR <0.7 12/16/2024    CT Soft Tissue  Neck W Contrast Result Date: 06/12/2024 EXAM: CT NECK WITH CONTRAST 06/10/2024 08:47:10 AM TECHNIQUE: CT of the neck was performed with the administration of intravenous contrast. Multiplanar reformatted images are provided for review. Automated exposure control, iterative reconstruction, and/or weight based adjustment of the mA/kV was utilized to reduce the radiation dose to as low as reasonably achievable. COMPARISON: None available. CLINICAL HISTORY: Left sided neck mass. Sore throat. Cervical lymphadenitis. FINDINGS: AERODIGESTIVE TRACT: No discrete mass. No edema. SALIVARY GLANDS: Mild enlargement and hyper-enhancement of the left submandibular gland without radiopaque stone. Minimal surrounding inflammatory changes. Findings are most suggestive of sialadenitis. THYROID : Unremarkable. LYMPH NODES: Enlarged left infraclavicular lymph node measuring up to 24 x 18 mm on axial image 97 series 3. Mild surrounding fat stranding is suggestive of reactive or inflamed lymph node. SOFT TISSUES: No mass or fluid collection. BRAIN, ORBITS, SINUSES AND MASTOIDS: Mucous retention cyst in the right maxillary sinus. LUNGS AND MEDIASTINUM: No acute abnormality. BONES: Mild cervical spondylosis without high-grade spinal canal stenosis. No suspicious bone lesions. IMPRESSION: 1. Mild enlargement and hyper-enhancement of the left submandibular gland without radiopaque stone, with minimal surrounding inflammatory changes, suggestive of sialadenitis. 2. Enlarged left infraclavicular lymph node measuring up to 24 x 18 mm with mild surrounding fat stranding, suggestive of reactive or  inflamed lymph node. Follow up to resolution is recommended. Electronically signed by: Ryan Chess MD 06/12/2024 12:35 PM EDT RP Workstation: [id number]    Assessment & Plan:   Hyperlipidemia with target LDL less than 70- LDL goal achieved. Doing well on the statin  -     Lipid panel; Future -     Hepatic function panel; Future  Elevated PSA- His PSA is not rising. -     PSA; Future -     Urinalysis, Routine w reflex microscopic; Future  Bradycardia -     EKG 12-Lead -     TSH; Future  Idiopathic chronic gout of foot without tophus, unspecified laterality -     Uric acid; Future  Coronary artery disease of native artery of native heart with stable angina pectoris -     Wegovy ; Inject 2.4 mg into the skin once a week.  Dispense: 9 mL; Refill: 0 -     Aspirin  EC; Take 1 tablet (81 mg total) by mouth daily.  Dispense: 90 tablet; Refill: 1  Primary hypertension- BP is well controlled/ -     Urinalysis, Routine w reflex microscopic; Future -     CBC with Differential/Platelet; Future -     Basic metabolic panel with GFR; Future  Unilateral primary osteoarthritis, left knee  Encounter for general adult medical examination with abnormal findings- Exam completed, labs reviewed, vaccines reviewed (he refused), cancer screenings addressed, pt ed material was given.   Type 2 diabetes mellitus with other diabetic arthropathy, without long-term current use of insulin  (HCC)- Blood sugar is well controlled. -     Microalbumin / creatinine urine ratio; Future -     Hemoglobin A1c; Future -     Basic metabolic panel with GFR; Future -     Vitamin B12; Future -     Wegovy ; Inject 2.4 mg into the skin once a week.  Dispense: 9 mL; Refill: 0     Follow-up: Return in about 6 months (around 06/15/2025).  Debby Molt, MD "

## 2024-12-16 NOTE — Patient Instructions (Signed)
 Health Maintenance, Male  Adopting a healthy lifestyle and getting preventive care are important in promoting health and wellness. Ask your health care provider about:  The right schedule for you to have regular tests and exams.  Things you can do on your own to prevent diseases and keep yourself healthy.  What should I know about diet, weight, and exercise?  Eat a healthy diet    Eat a diet that includes plenty of vegetables, fruits, low-fat dairy products, and lean protein.  Do not eat a lot of foods that are high in solid fats, added sugars, or sodium.  Maintain a healthy weight  Body mass index (BMI) is a measurement that can be used to identify possible weight problems. It estimates body fat based on height and weight. Your health care provider can help determine your BMI and help you achieve or maintain a healthy weight.  Get regular exercise  Get regular exercise. This is one of the most important things you can do for your health. Most adults should:  Exercise for at least 150 minutes each week. The exercise should increase your heart rate and make you sweat (moderate-intensity exercise).  Do strengthening exercises at least twice a week. This is in addition to the moderate-intensity exercise.  Spend less time sitting. Even light physical activity can be beneficial.  Watch cholesterol and blood lipids  Have your blood tested for lipids and cholesterol at 71 years of age, then have this test every 5 years.  You may need to have your cholesterol levels checked more often if:  Your lipid or cholesterol levels are high.  You are older than 71 years of age.  You are at high risk for heart disease.  What should I know about cancer screening?  Many types of cancers can be detected early and may often be prevented. Depending on your health history and family history, you may need to have cancer screening at various ages. This may include screening for:  Colorectal cancer.  Prostate cancer.  Skin cancer.  Lung  cancer.  What should I know about heart disease, diabetes, and high blood pressure?  Blood pressure and heart disease  High blood pressure causes heart disease and increases the risk of stroke. This is more likely to develop in people who have high blood pressure readings or are overweight.  Talk with your health care provider about your target blood pressure readings.  Have your blood pressure checked:  Every 3-5 years if you are 71-71 years of age.  Every year if you are 3 years old or older.  If you are between the ages of 60 and 72 and are a current or former smoker, ask your health care provider if you should have a one-time screening for abdominal aortic aneurysm (AAA).  Diabetes  Have regular diabetes screenings. This checks your fasting blood sugar level. Have the screening done:  Once every three years after age 66 if you are at a normal weight and have a low risk for diabetes.  More often and at a younger age if you are overweight or have a high risk for diabetes.  What should I know about preventing infection?  Hepatitis B  If you have a higher risk for hepatitis B, you should be screened for this virus. Talk with your health care provider to find out if you are at risk for hepatitis B infection.  Hepatitis C  Blood testing is recommended for:  Everyone born from [birth year] through 1967.  Anyone  with known risk factors for hepatitis C.  Sexually transmitted infections (STIs)  You should be screened each year for STIs, including gonorrhea and chlamydia, if:  You are sexually active and are younger than 71 years of age.  You are older than 71 years of age and your health care provider tells you that you are at risk for this type of infection.  Your sexual activity has changed since you were last screened, and you are at increased risk for chlamydia or gonorrhea. Ask your health care provider if you are at risk.  Ask your health care provider about whether you are at high risk for HIV. Your health care provider  may recommend a prescription medicine to help prevent HIV infection. If you choose to take medicine to prevent HIV, you should first get tested for HIV. You should then be tested every 3 months for as long as you are taking the medicine.  Follow these instructions at home:  Alcohol use  Do not drink alcohol if your health care provider tells you not to drink.  If you drink alcohol:  Limit how much you have to 0-2 drinks a day.  Know how much alcohol is in your drink. In the [place] one drink equals one 12 oz bottle of beer (355 mL), one 5 oz glass of wine (148 mL), or one 1 oz glass of hard liquor (44 mL).  Lifestyle  Do not use any products that contain nicotine or tobacco. These products include cigarettes, chewing tobacco, and vaping devices, such as e-cigarettes. If you need help quitting, ask your health care provider.  Do not use street drugs.  Do not share needles.  Ask your health care provider for help if you need support or information about quitting drugs.  General instructions  Schedule regular health, dental, and eye exams.  Stay current with your vaccines.  Tell your health care provider if:  You often feel depressed.  You have ever been abused or do not feel safe at home.  Summary  Adopting a healthy lifestyle and getting preventive care are important in promoting health and wellness.  Follow your health care provider's instructions about healthy diet, exercising, and getting tested or screened for diseases.  Follow your health care provider's instructions on monitoring your cholesterol and blood pressure.  This information is not intended to replace advice given to you by your health care provider. Make sure you discuss any questions you have with your health care provider.  Document Revised: 04/03/2021 Document Reviewed: 04/03/2021  [employer]vier Patient Education  2024 [employer].

## 2024-12-17 ENCOUNTER — Ambulatory Visit: Payer: Self-pay | Admitting: Internal Medicine

## 2024-12-17 ENCOUNTER — Other Ambulatory Visit: Payer: Self-pay | Admitting: Internal Medicine

## 2024-12-17 DIAGNOSIS — E785 Hyperlipidemia, unspecified: Secondary | ICD-10-CM

## 2024-12-17 DIAGNOSIS — I25118 Atherosclerotic heart disease of native coronary artery with other forms of angina pectoris: Secondary | ICD-10-CM

## 2024-12-17 MED ORDER — ASPIRIN EC 81 MG PO TBEC: 81.0000 mg | DELAYED_RELEASE_TABLET | Freq: Every day | ORAL | 1 refills | Status: AC

## 2025-09-15 ENCOUNTER — Ambulatory Visit

## 2025-09-15 ENCOUNTER — Encounter: Admitting: Internal Medicine
